# Patient Record
Sex: Male | Born: 1975 | Race: White | Hispanic: No | Marital: Married | Smoking: Never smoker
Health system: Southern US, Community
[De-identification: ages and names within clinical notes are randomized; demographics above are authoritative.]

## PROBLEM LIST (undated history)

## (undated) DIAGNOSIS — K219 Gastro-esophageal reflux disease without esophagitis: Secondary | ICD-10-CM

## (undated) DIAGNOSIS — J309 Allergic rhinitis, unspecified: Secondary | ICD-10-CM

## (undated) DIAGNOSIS — E785 Hyperlipidemia, unspecified: Secondary | ICD-10-CM

## (undated) DIAGNOSIS — R7309 Other abnormal glucose: Secondary | ICD-10-CM

## (undated) HISTORY — PX: TONSILLECTOMY AND ADENOIDECTOMY: SHX28

## (undated) HISTORY — DX: Hyperlipidemia, unspecified: E78.5

## (undated) HISTORY — DX: Gastro-esophageal reflux disease without esophagitis: K21.9

## (undated) HISTORY — DX: Allergic rhinitis, unspecified: J30.9

## (undated) HISTORY — PX: TONSILLECTOMY: SUR1361

## (undated) HISTORY — DX: Other abnormal glucose: R73.09

---

## 2003-06-29 ENCOUNTER — Ambulatory Visit (HOSPITAL_BASED_OUTPATIENT_CLINIC_OR_DEPARTMENT_OTHER): Admission: RE | Admit: 2003-06-29 | Discharge: 2003-06-29 | Payer: Self-pay | Admitting: Sports Medicine

## 2007-02-10 ENCOUNTER — Emergency Department (HOSPITAL_COMMUNITY): Admission: EM | Admit: 2007-02-10 | Discharge: 2007-02-10 | Payer: Self-pay | Admitting: Emergency Medicine

## 2010-07-01 ENCOUNTER — Ambulatory Visit
Admission: RE | Admit: 2010-07-01 | Discharge: 2010-07-01 | Disposition: A | Discharge status: Home / Self Care | Payer: BC Managed Care – PPO | Source: Ambulatory Visit | Attending: Internal Medicine | Admitting: Internal Medicine

## 2010-07-01 ENCOUNTER — Other Ambulatory Visit: Payer: Self-pay | Admitting: Internal Medicine

## 2010-07-01 DIAGNOSIS — R05 Cough: Secondary | ICD-10-CM

## 2010-07-29 ENCOUNTER — Ambulatory Visit
Admission: RE | Admit: 2010-07-29 | Discharge: 2010-07-29 | Disposition: A | Discharge status: Home / Self Care | Payer: BC Managed Care – PPO | Source: Ambulatory Visit | Attending: Internal Medicine | Admitting: Internal Medicine

## 2010-07-29 ENCOUNTER — Other Ambulatory Visit: Payer: Self-pay | Admitting: Internal Medicine

## 2010-07-29 DIAGNOSIS — R05 Cough: Secondary | ICD-10-CM

## 2010-09-27 LAB — POCT RAPID STREP A: Streptococcus, Group A Screen (Direct): NEGATIVE

## 2012-02-29 ENCOUNTER — Encounter (HOSPITAL_COMMUNITY): Payer: Self-pay | Admitting: Emergency Medicine

## 2012-02-29 ENCOUNTER — Emergency Department (HOSPITAL_COMMUNITY): Payer: BC Managed Care – PPO

## 2012-02-29 ENCOUNTER — Emergency Department (HOSPITAL_COMMUNITY)
Admission: EM | Admit: 2012-02-29 | Discharge: 2012-02-29 | Disposition: A | Discharge status: Home / Self Care | Payer: BC Managed Care – PPO | Attending: Emergency Medicine | Admitting: Emergency Medicine

## 2012-02-29 DIAGNOSIS — Y9289 Other specified places as the place of occurrence of the external cause: Secondary | ICD-10-CM | POA: Insufficient documentation

## 2012-02-29 DIAGNOSIS — R110 Nausea: Secondary | ICD-10-CM | POA: Insufficient documentation

## 2012-02-29 DIAGNOSIS — W1809XA Striking against other object with subsequent fall, initial encounter: Secondary | ICD-10-CM | POA: Insufficient documentation

## 2012-02-29 DIAGNOSIS — S0990XA Unspecified injury of head, initial encounter: Secondary | ICD-10-CM | POA: Insufficient documentation

## 2012-02-29 DIAGNOSIS — R404 Transient alteration of awareness: Secondary | ICD-10-CM | POA: Insufficient documentation

## 2012-02-29 DIAGNOSIS — Y9301 Activity, walking, marching and hiking: Secondary | ICD-10-CM | POA: Insufficient documentation

## 2012-02-29 DIAGNOSIS — R51 Headache: Secondary | ICD-10-CM

## 2012-02-29 NOTE — ED Notes (Signed)
Pt states that on Friday he was at work and didn't want to wait for an elevator.  States he went up 10 flights of stairs.  When he got to the top, he felt winded, so he sat down and about a minute or two, stood up and then passed out.  States that it was unwitnessed.  Pt saw his PCP.  They did an EKG which was fine.  Pt did not have a headache at that time.  Since then, pt has developed a headache.  Was told that if he developed a headache to come to the ER.

## 2012-02-29 NOTE — ED Provider Notes (Signed)
History     CSN: [id number]  Arrival date & time 02/29/12  1903   First MD Initiated Contact with Patient 02/29/12 1939      Chief Complaint  Patient presents with  . Fall  . Loss of Consciousness  . Headache    (Consider location/radiation/quality/duration/timing/severity/associated sxs/prior treatment) HPI Patient suffered a syncopal event 2 days ago after he walked up 10 flights of steps, fell winded and nauseated. As a result syncopal event he struck his head. He has developed a mild right-sided parietal headache radiating to her right periorbital area gradual onset yesterday. He denies nausea denies visual changes denies neck pain denies other complaint. Nothing makes symptoms better or worse he denies chest pain no other associated symptoms. Patient was seen by his primary care physician after the syncopal event an outpatient cardiac workup has been arranged. He presently feels fine other than his headache. No treatment prior to coming here. History reviewed. No pertinent past medical history.  Past Surgical History  Procedure Laterality Date  . Tonsillectomy      History reviewed. No pertinent family history.  History  Substance Use Topics  . Smoking status: Never Smoker   . Smokeless tobacco: Not on file  . Alcohol Use: No   no alcohol no drugs    Review of Systems  Constitutional: Negative.   Respiratory: Negative.   Cardiovascular: Negative.   Gastrointestinal: Negative.   Musculoskeletal: Negative.   Skin: Negative.   Neurological: Positive for headaches.  Psychiatric/Behavioral: Negative.   All other systems reviewed and are negative.    Allergies  Review of patient's allergies indicates not on file.  Home Medications  No current outpatient prescriptions on file.  BP 120/74  Pulse 90  Temp(Src) 98.5 F (36.9 C) (Oral)  Resp 16  Ht 5\' 9"  (1.753 m)  Wt 235 lb (106.595 kg)  BMI 34.69 kg/m2  SpO2 100%  Physical Exam  Nursing note and vitals  reviewed. Constitutional: He appears well-developed and well-nourished.  HENT:  Head: Normocephalic and atraumatic.  Bilateral tympanic membranes normal  Eyes: Conjunctivae are normal. Pupils are equal, round, and reactive to light.  Neck: Neck supple. No tracheal deviation present. No thyromegaly present.  Cardiovascular: Normal rate and regular rhythm.   No murmur heard. Pulmonary/Chest: Effort normal and breath sounds normal.  Abdominal: Soft. Bowel sounds are normal. He exhibits no distension. There is no tenderness.  Musculoskeletal: Normal range of motion. He exhibits no edema and no tenderness.  Entire spine nontender  Neurological: He is alert. Coordination normal.  Gait normal Romberg normal pronator normal  Skin: Skin is warm and dry. No rash noted.  Psychiatric: He has a normal mood and affect.    ED Course  Procedures (including critical care time)  Labs Reviewed - No data to display No results found. 9:10 PM patient resting comfortably alert Glasgow Coma Score 15. Declined pain medicine No diagnosis found.  Results for orders placed during the hospital encounter of 02/10/07  POCT RAPID STREP A (DEVICE)      Result Value Range   Streptococcus, Group A Screen (Direct) NEGATIVE     Ct Head Wo Contrast  02/29/2012  *RADIOLOGY REPORT*  Clinical Data: Larey Seat.  Loss of consciousness.  CT HEAD WITHOUT CONTRAST  Technique:  Contiguous axial images were obtained from the base of the skull through the vertex without contrast.  Comparison: None  Findings: The ventricles are normal.  No extra-axial fluid collections are seen.  The brainstem and cerebellum are unremarkable.  No acute intracranial findings such as infarction or hemorrhage.  No mass lesions.  The bony calvarium is intact. No acute fracture.  Scattered sinus disease.  The mastoid air cells and middle ear cavities are clear.  IMPRESSION: No acute intracranial findings or skull fracture.   Original Report Authenticated By:  Rudie Meyer, M.D.      MDM  I discussed with the patient's spouse I do not feel that CT scan is absolutely warranted however they wish to have a CT scan to rule out injury. Ordered by me   Plan patient to followup with Dr. Kathryne Sharper office tomorrow for his outpatient cardiac workup for syncope. Tylenol as needed for pain Diagnosis postconcussive headache     Doug Sou, MD 02/29/12 2113

## 2013-06-14 ENCOUNTER — Ambulatory Visit (INDEPENDENT_AMBULATORY_CARE_PROVIDER_SITE_OTHER): Payer: BC Managed Care – PPO | Admitting: Emergency Medicine

## 2013-06-14 ENCOUNTER — Encounter: Payer: Self-pay | Admitting: Emergency Medicine

## 2013-06-14 VITALS — BP 114/64 | HR 78 | Temp 98.2°F | Resp 18 | Ht 68.5 in | Wt 235.0 lb

## 2013-06-14 DIAGNOSIS — R7309 Other abnormal glucose: Secondary | ICD-10-CM

## 2013-06-14 DIAGNOSIS — J309 Allergic rhinitis, unspecified: Secondary | ICD-10-CM

## 2013-06-14 DIAGNOSIS — K219 Gastro-esophageal reflux disease without esophagitis: Secondary | ICD-10-CM

## 2013-06-14 DIAGNOSIS — R5383 Other fatigue: Secondary | ICD-10-CM

## 2013-06-14 DIAGNOSIS — E782 Mixed hyperlipidemia: Secondary | ICD-10-CM

## 2013-06-14 DIAGNOSIS — R5381 Other malaise: Secondary | ICD-10-CM

## 2013-06-14 LAB — CBC WITH DIFFERENTIAL/PLATELET
Basophils Absolute: 0.1 10*3/uL (ref 0.0–0.1)
Basophils Relative: 1 % (ref 0–1)
EOS ABS: 0.1 10*3/uL (ref 0.0–0.7)
EOS PCT: 2 % (ref 0–5)
HEMATOCRIT: 38.4 % — AB (ref 39.0–52.0)
HEMOGLOBIN: 13.1 g/dL (ref 13.0–17.0)
LYMPHS ABS: 2.3 10*3/uL (ref 0.7–4.0)
Lymphocytes Relative: 38 % (ref 12–46)
MCH: 28.2 pg (ref 26.0–34.0)
MCHC: 34.1 g/dL (ref 30.0–36.0)
MCV: 82.8 fL (ref 78.0–100.0)
MONO ABS: 0.5 10*3/uL (ref 0.1–1.0)
MONOS PCT: 9 % (ref 3–12)
Neutro Abs: 3.1 10*3/uL (ref 1.7–7.7)
Neutrophils Relative %: 50 % (ref 43–77)
Platelets: 225 10*3/uL (ref 150–400)
RBC: 4.64 MIL/uL (ref 4.22–5.81)
RDW: 14.4 % (ref 11.5–15.5)
WBC: 6.1 10*3/uL (ref 4.0–10.5)

## 2013-06-14 MED ORDER — PRAVASTATIN SODIUM 10 MG PO TABS
10.0000 mg | ORAL_TABLET | Freq: Every day | ORAL | Status: DC
Start: 2013-06-14 — End: 2013-09-21

## 2013-06-14 NOTE — Progress Notes (Signed)
Subjective:    Patient ID: Adrian Francis, male    DOB: 06-26-1975, 38 y.o.   MRN: [id number]  HPI Comments: 38 YO WM presents for 3 month F/U for Cholesterol, Pre-Dm, D. Deficient. He is exercising more. He is eating healthier. He has had sleep study in past with + result but was unable to wear mask due to skin irritation. He has been having more sleep difficulty and more fatigue. He has gained weight since study in 2006. He has not followed up AD for recheck of labs.   T 168 TG 145 L 99 A1C 6.0 TO 5.5  Hyperlipidemia  Gastrophageal Reflux Associated symptoms include fatigue.     Medication List       This list is accurate as of: 06/14/13 11:59 PM.  Always use your most recent med list.               aspirin 81 MG tablet  Take 81 mg by mouth daily.     Glucosamine-MSM 500-500 MG Tabs  Take 1,500 mg by mouth 3 (three) times a week.     loratadine 10 MG tablet  Commonly known as:  CLARITIN  Take 10 mg by mouth every morning.     Magnesium 250 MG Tabs  Take 250 mg by mouth every morning.     Melatonin 3 MG Tabs  Take 6 mg by mouth at bedtime as needed (for sleep).     montelukast 10 MG tablet  Commonly known as:  SINGULAIR  Take 10 mg by mouth every morning.     OVER THE COUNTER MEDICATION  Take 2 capsules by mouth daily. zyflamend - spice mixture as an antiinflammatory     OVER THE COUNTER MEDICATION  Take 1-2 capsules by mouth daily as needed (for anxiety). True calm     OVER THE COUNTER MEDICATION  2 tablets daily. Microlactin     OVER THE COUNTER MEDICATION  2 tablets daily as needed. Quercetin with Bromelein     pravastatin 10 MG tablet  Commonly known as:  PRAVACHOL  Take 1 tablet (10 mg total) by mouth at bedtime.     ranitidine 150 MG tablet  Commonly known as:  ZANTAC  Take 150 mg by mouth 2 (two) times daily.     Vitamin D-3 5000 UNITS Tabs  Take 5,000 Units by mouth daily.     Zinc 50 MG Tabs  Take 50 mg by mouth daily.       No Known  Allergies  Past Medical History  Diagnosis Date  . Hyperlipidemia   . Other abnormal glucose   . GERD (gastroesophageal reflux disease)   . Allergic rhinitis, cause unspecified      Review of Systems  Constitutional: Positive for fatigue.  Psychiatric/Behavioral: Positive for sleep disturbance.  All other systems reviewed and are negative.  BP 114/64  Pulse 78  Temp(Src) 98.2 F (36.8 C) (Temporal)  Resp 18  Ht 5' 8.5" (1.74 m)  Wt 235 lb (106.595 kg)  BMI 35.21 kg/m2     Objective:   Physical Exam  Nursing note and vitals reviewed. Constitutional: He is oriented to person, place, and time. He appears well-developed and well-nourished.  obese  HENT:  Head: Normocephalic and atraumatic.  Right Ear: External ear normal.  Left Ear: External ear normal.  Nose: Nose normal.  Eyes: Conjunctivae and EOM are normal.  Neck: Normal range of motion. Neck supple. No JVD present. No thyromegaly present.  Cardiovascular: Normal rate, regular  rhythm, normal heart sounds and intact distal pulses.   Pulmonary/Chest: Effort normal and breath sounds normal.  Abdominal: Soft. Bowel sounds are normal. He exhibits no distension and no mass. There is no tenderness. There is no rebound and no guarding.  Musculoskeletal: Normal range of motion. He exhibits no edema and no tenderness.  Lymphadenopathy:    He has no cervical adenopathy.  Neurological: He is alert and oriented to person, place, and time. He has normal reflexes. No cranial nerve deficit. Coordination normal.  Skin: Skin is warm and dry.  Psychiatric: He has a normal mood and affect. His behavior is normal. Judgment and thought content normal.          Assessment & Plan:  1. Cholesterol- recheck labs, Need to eat healthier and exercise AD.  2. Elevated BS- recheck labs  3. Fatigue vs sleep issues- Will let us know if desires repeat sleep study with mask intolerance due to skin allergy in past. Wife wants to try weight  loss first. Advised of sleep hygiene

## 2013-06-14 NOTE — Patient Instructions (Signed)
Sleep Apnea  Sleep apnea is disorder that affects a person's sleep. A person with sleep apnea has abnormal pauses in their breathing when they sleep. It is hard for them to get a good sleep. This makes a person tired during the day. It also can lead to other physical problems. There are three types of sleep apnea. One type is when breathing stops for a short time because your airway is blocked (obstructive sleep apnea). Another type is when the brain sometimes fails to give the normal signal to breathe to the muscles that control your breathing (central sleep apnea). The third type is a combination of the other two types.  HOME CARE  · Do not sleep on your back. Try to sleep on your side.  · Take all medicine as told by your doctor.  · Avoid alcohol, calming medicines (sedatives), and depressant drugs.  · Try to lose weight if you are overweight. Talk to your doctor about a healthy weight goal.  Your doctor may have you use a device that helps to open your airway. It can help you get the air that you need. It is called a positive airway pressure (PAP) device. There are three types of PAP devices:  · Continuous positive airway pressure (CPAP) device.  · Nasal expiratory positive airway pressure (EPAP) device.  · Bilevel positive airway pressure (BPAP) device.  MAKE SURE YOU:  · Understand these instructions.  · Will watch your condition.  · Will get help right away if you are not doing well or get worse.  Document Released: 10/02/2007 Document Revised: 12/10/2011 Document Reviewed: 04/26/2011  ExitCare® Patient Information ©2014 [employer].

## 2013-06-15 ENCOUNTER — Encounter: Payer: Self-pay | Admitting: Emergency Medicine

## 2013-06-15 ENCOUNTER — Other Ambulatory Visit: Payer: Self-pay | Admitting: Internal Medicine

## 2013-06-15 DIAGNOSIS — E782 Mixed hyperlipidemia: Secondary | ICD-10-CM | POA: Insufficient documentation

## 2013-06-15 DIAGNOSIS — R7303 Prediabetes: Secondary | ICD-10-CM | POA: Insufficient documentation

## 2013-06-15 DIAGNOSIS — J309 Allergic rhinitis, unspecified: Secondary | ICD-10-CM | POA: Insufficient documentation

## 2013-06-15 DIAGNOSIS — K219 Gastro-esophageal reflux disease without esophagitis: Secondary | ICD-10-CM | POA: Insufficient documentation

## 2013-06-15 LAB — HEPATIC FUNCTION PANEL
ALBUMIN: 4.6 g/dL (ref 3.5–5.2)
ALK PHOS: 56 U/L (ref 39–117)
ALT: 25 U/L (ref 0–53)
AST: 20 U/L (ref 0–37)
BILIRUBIN INDIRECT: 0.4 mg/dL (ref 0.2–1.2)
BILIRUBIN TOTAL: 0.5 mg/dL (ref 0.2–1.2)
Bilirubin, Direct: 0.1 mg/dL (ref 0.0–0.3)
TOTAL PROTEIN: 7.4 g/dL (ref 6.0–8.3)

## 2013-06-15 LAB — INSULIN, FASTING: Insulin fasting, serum: 9 u[IU]/mL (ref 3–28)

## 2013-06-15 LAB — LIPID PANEL
CHOL/HDL RATIO: 3.8 ratio
Cholesterol: 180 mg/dL (ref 0–200)
HDL: 47 mg/dL (ref >39)
LDL CALC: 108 mg/dL — AB (ref 0–99)
TRIGLYCERIDES: 123 mg/dL (ref <150)
VLDL: 25 mg/dL (ref 0–40)

## 2013-06-15 LAB — BASIC METABOLIC PANEL WITH GFR
BUN: 15 mg/dL (ref 6–23)
CALCIUM: 9.7 mg/dL (ref 8.4–10.5)
CO2: 29 meq/L (ref 19–32)
CREATININE: 1.11 mg/dL (ref 0.50–1.35)
Chloride: 101 mEq/L (ref 96–112)
GFR, Est Non African American: 86 mL/min
GLUCOSE: 84 mg/dL (ref 70–99)
Potassium: 4.2 mEq/L (ref 3.5–5.3)
Sodium: 137 mEq/L (ref 135–145)

## 2013-06-15 LAB — HEMOGLOBIN A1C
Hgb A1c MFr Bld: 5.8 % — ABNORMAL HIGH (ref <5.7)
Mean Plasma Glucose: 120 mg/dL — ABNORMAL HIGH (ref <117)

## 2013-06-15 LAB — TSH: TSH: 1.218 u[IU]/mL (ref 0.350–4.500)

## 2013-06-15 LAB — VITAMIN B12: Vitamin B-12: 447 pg/mL (ref 211–911)

## 2013-06-15 LAB — TESTOSTERONE: Testosterone: 317 ng/dL (ref 300–890)

## 2013-07-07 ENCOUNTER — Encounter: Payer: Self-pay | Admitting: Cardiology

## 2013-09-21 ENCOUNTER — Ambulatory Visit (INDEPENDENT_AMBULATORY_CARE_PROVIDER_SITE_OTHER): Payer: BC Managed Care – PPO | Admitting: Emergency Medicine

## 2013-09-21 ENCOUNTER — Encounter: Payer: Self-pay | Admitting: Emergency Medicine

## 2013-09-21 VITALS — BP 116/82 | HR 62 | Temp 98.2°F | Resp 16 | Ht 68.5 in | Wt 233.0 lb

## 2013-09-21 DIAGNOSIS — E782 Mixed hyperlipidemia: Secondary | ICD-10-CM

## 2013-09-21 DIAGNOSIS — Z23 Encounter for immunization: Secondary | ICD-10-CM

## 2013-09-21 DIAGNOSIS — Z125 Encounter for screening for malignant neoplasm of prostate: Secondary | ICD-10-CM

## 2013-09-21 DIAGNOSIS — Z111 Encounter for screening for respiratory tuberculosis: Secondary | ICD-10-CM

## 2013-09-21 DIAGNOSIS — Z0000 Encounter for general adult medical examination without abnormal findings: Secondary | ICD-10-CM

## 2013-09-21 NOTE — Patient Instructions (Signed)
Tdap Vaccine (Tetanus, Diphtheria, Pertussis): What You Need to Know 1. Why get vaccinated? Tetanus, diphtheria and pertussis can be very serious diseases, even for adolescents and adults. Tdap vaccine can protect us from these diseases. TETANUS (Lockjaw) causes painful muscle tightening and stiffness, usually all over the body.  It can lead to tightening of muscles in the head and neck so you can't open your mouth, swallow, or sometimes even breathe. Tetanus kills about 1 out of 5 people who are infected. DIPHTHERIA can cause a thick coating to form in the back of the throat.  It can lead to breathing problems, paralysis, heart failure, and death. PERTUSSIS (Whooping Cough) causes severe coughing spells, which can cause difficulty breathing, vomiting and disturbed sleep.  It can also lead to weight loss, incontinence, and rib fractures. Up to 2 in 100 adolescents and 5 in 100 adults with pertussis are hospitalized or have complications, which could include pneumonia or death. These diseases are caused by bacteria. Diphtheria and pertussis are spread from person to person through coughing or sneezing. Tetanus enters the body through cuts, scratches, or wounds. Before vaccines, the [place] saw as many as 200,000 cases a year of diphtheria and pertussis, and hundreds of cases of tetanus. Since vaccination began, tetanus and diphtheria have dropped by about 99% and pertussis by about 80%. 2. Tdap vaccine Tdap vaccine can protect adolescents and adults from tetanus, diphtheria, and pertussis. One dose of Tdap is routinely given at age 11 or 12. People who did not get Tdap at that age should get it as soon as possible. Tdap is especially important for health care professionals and anyone having close contact with a baby younger than 12 months. Pregnant women should get a dose of Tdap during every pregnancy, to protect the newborn from pertussis. Infants are most at risk for severe, life-threatening  complications from pertussis. A similar vaccine, called Td, protects from tetanus and diphtheria, but not pertussis. A Td booster should be given every 10 years. Tdap may be given as one of these boosters if you have not already gotten a dose. Tdap may also be given after a severe cut or burn to prevent tetanus infection. Your doctor can give you more information. Tdap may safely be given at the same time as other vaccines. 3. Some people should not get this vaccine  If you ever had a life-threatening allergic reaction after a dose of any tetanus, diphtheria, or pertussis containing vaccine, OR if you have a severe allergy to any part of this vaccine, you should not get Tdap. Tell your doctor if you have any severe allergies.  If you had a coma, or long or multiple seizures within 7 days after a childhood dose of DTP or DTaP, you should not get Tdap, unless a cause other than the vaccine was found. You can still get Td.  Talk to your doctor if you:  have epilepsy or another nervous system problem,  had severe pain or swelling after any vaccine containing diphtheria, tetanus or pertussis,  ever had Guillain-Barr Syndrome (GBS),  aren't feeling well on the day the shot is scheduled. 4. Risks of a vaccine reaction With any medicine, including vaccines, there is a chance of side effects. These are usually mild and go away on their own, but serious reactions are also possible. Brief fainting spells can follow a vaccination, leading to injuries from falling. Sitting or lying down for about 15 minutes can help prevent these. Tell your doctor if you feel   dizzy or light-headed, or have vision changes or ringing in the ears. Mild problems following Tdap (Did not interfere with activities)  Pain where the shot was given (about 3 in 4 adolescents or 2 in 3 adults)  Redness or swelling where the shot was given (about 1 person in 5)  Mild fever of at least 100.4F (up to about 1 in 25 adolescents or  1 in 100 adults)  Headache (about 3 or 4 people in 10)  Tiredness (about 1 person in 3 or 4)  Nausea, vomiting, diarrhea, stomach ache (up to 1 in 4 adolescents or 1 in 10 adults)  Chills, body aches, sore joints, rash, swollen glands (uncommon) Moderate problems following Tdap (Interfered with activities, but did not require medical attention)  Pain where the shot was given (about 1 in 5 adolescents or 1 in 100 adults)  Redness or swelling where the shot was given (up to about 1 in 16 adolescents or 1 in 25 adults)  Fever over 102F (about 1 in 100 adolescents or 1 in 250 adults)  Headache (about 3 in 20 adolescents or 1 in 10 adults)  Nausea, vomiting, diarrhea, stomach ache (up to 1 or 3 people in 100)  Swelling of the entire arm where the shot was given (up to about 3 in 100). Severe problems following Tdap (Unable to perform usual activities; required medical attention)  Swelling, severe pain, bleeding and redness in the arm where the shot was given (rare). A severe allergic reaction could occur after any vaccine (estimated less than 1 in a million doses). 5. What if there is a serious reaction? What should I look for?  Look for anything that concerns you, such as signs of a severe allergic reaction, very high fever, or behavior changes. Signs of a severe allergic reaction can include hives, swelling of the face and throat, difficulty breathing, a fast heartbeat, dizziness, and weakness. These would start a few minutes to a few hours after the vaccination. What should I do?  If you think it is a severe allergic reaction or other emergency that can't wait, call 9-1-1 or get the person to the nearest hospital. Otherwise, call your doctor.  Afterward, the reaction should be reported to the "Vaccine Adverse Event Reporting System" (VAERS). Your doctor might file this report, or you can do it yourself through the VAERS web site at www.vaers.hhs.gov, or by calling  1-800-822-7967. VAERS is only for reporting reactions. They do not give medical advice.  6. The National Vaccine Injury Compensation Program The National Vaccine Injury Compensation Program (VICP) is a federal program that was created to compensate people who may have been injured by certain vaccines. Persons who believe they may have been injured by a vaccine can learn about the program and about filing a claim by calling 1-800-338-2382 or visiting the VICP website at www.hrsa.gov/vaccinecompensation. 7. How can I learn more?  Ask your doctor.  Call your local or state health department.  Contact the Centers for Disease Control and Prevention (CDC):  Call 1-800-232-4636 or visit CDC's website at www.cdc.gov/vaccines. CDC Tdap Vaccine VIS (05/15/11) Document Released: 06/24/2011 Document Revised: 05/09/2013 Document Reviewed: 04/06/2013 Exit[employer] Patient Information 2015 [employer]. This information is not intended to replace advice given to you by your health care provider. Make sure you discuss any questions you have with your health care provider. Tuberculin Skin Test The PPD skin test is a method used to help with the diagnosis of a disease called tuberculosis (TB). HOW THE TEST   IS DONE  The test site (usually the forearm) is cleansed. The PPD extract is then injected under the top layer of skin, causing a blister to form on the skin. The reaction will take 48 - 72 hours to develop. You must return to your health care provider within that time to have the area checked. This will determine whether you have had a significant reaction to the PPD test. A reaction is measured in millimeters of hard swelling (induration) at the site. PREPARATION FOR TEST  There is no special preparation for this test. People with a skin rash or other skin irritations on their arms may need to have the test performed at a different spot on the body. Tell your health care provider if you have ever had a  positive PPD skin test. If so, you should not have a repeat PPD test. Tell your doctor if you have a medical condition or if you take certain drugs, such as steroids, that can affect your immune system. These situations may lead to inaccurate test results. NORMAL FINDINGS A negative reaction (no induration) or a level of hard swelling that falls below a certain cutoff may mean that a person has not been infected with the bacteria that cause TB. There are different cutoffs for children, people with HIV, and other risk groups. Unfortunately, this is not a perfect test, and up to 20% of people infected with tuberculosis may not have a reaction on the PPD skin test. In addition, certain conditions that affect the immune system (cancer, recent chemotherapy, late-stage AIDS) may cause a false-negative test result.  The reaction will take 48 - 72 hours to develop. You must return to your health care provider within that time to have the area checked. Follow your caregiver's instructions as to where and when to report for this to be done. Ranges for normal findings may vary among different laboratories and hospitals. You should always check with your doctor after having lab work or other tests done to discuss the meaning of your test results and whether your values are considered within normal limits. WHAT ABNORMAL RESULTS MEAN  The results of the test depend on the size of the skin reaction and on the person being tested.  A small reaction (5 mm of hard swelling at the site) is considered to be positive in people who have HIV, who are taking steroid therapy, or who have been in close contact with a person who has active tuberculosis. Larger reactions (greater than or equal to 10 mm) are considered positive in people with diabetes or kidney failure, and in health care workers, among others. In people with no known risks for tuberculosis, a positive reaction requires 15 mm or more of hard swelling at the  site. RISKS AND COMPLICATIONS There is a very small risk of severe redness and swelling of the arm in people who have had a previous positive PPD test and who have the test again. There also have been a few rare cases of this reaction in people who have not been tested before. CONSIDERATIONS  A positive skin test does not necessarily mean that a person has active tuberculosis. More tests will be done to check whether active disease is present. Many people who were born outside the [place] may have had a vaccine called "BCG," which can lead to a false-positive test result. MEANING OF TEST  Your caregiver will go over the test results with you and discuss the importance and meaning of your results,   as well as treatment options and the need for additional tests if necessary. OBTAINING THE TEST RESULTS It is your responsibility to obtain your test results. Ask the lab or department performing the test when and how you will get your results. Document Released: 10/02/2004 Document Revised: 03/17/2011 Document Reviewed: 04/01/2013 Exit[employer] Patient Information 2015 [employer]. This information is not intended to replace advice given to you by your health care provider. Make sure you discuss any questions you have with your health care provider.  

## 2013-09-21 NOTE — Progress Notes (Signed)
Subjective:    Patient ID: Adrian Francis, male    DOB: 1975-12-16, 38 y.o.   MRN: [id number]  HPI Comments: 38 yo WM CPE and cholesterol f/u. He is eating healthy for most part. He has restarted exercising x 1 week. He has increased pravastatin AD. He is doing well overall. He notes taking multiple supplements which have helped with allergies/ reflux.    WBC             6.1   06/14/2013 HGB            13.1   06/14/2013 HCT            38.4   06/14/2013 PLT             225   06/14/2013 GLUCOSE          84   06/14/2013 CHOL            180   06/14/2013 TRIG            123   06/14/2013 HDL              47   06/14/2013 LDLCALC         108   06/14/2013 ALT              25   06/14/2013 AST              20   06/14/2013 NA              137   06/14/2013 K               4.2   06/14/2013 CL              101   06/14/2013 CREATININE     1.11   06/14/2013 BUN              15   06/14/2013 CO2              29   06/14/2013 TSH           1.218   06/14/2013 HGBA1C          5.8   06/14/2013   Hyperlipidemia Pertinent negatives include no chest pain or shortness of breath.  Gastrophageal Reflux He reports no chest pain. Pertinent negatives include no fatigue.     Medication List       This list is accurate as of: 09/21/13 11:59 PM.  Always use your most recent med list.               aspirin 81 MG tablet  Take 81 mg by mouth daily.     loratadine 10 MG tablet  Commonly known as:  CLARITIN  Take 10 mg by mouth every morning.     Magnesium 250 MG Tabs  Take 250 mg by mouth every morning.     Melatonin 3 MG Tabs  Take 6 mg by mouth at bedtime as needed (for sleep).     montelukast 10 MG tablet  Commonly known as:  SINGULAIR  TAKE 1 TABLET DAILY FOR ALLERGY     OVER THE COUNTER MEDICATION  Take 2 capsules by mouth daily. zyflamend - spice mixture as an antiinflammatory     OVER THE COUNTER MEDICATION  Take 1-2 capsules by mouth daily as needed (for anxiety). True calm     OVER THE COUNTER MEDICATION  2 tablets  daily. Microlactin     OVER THE  COUNTER MEDICATION  2 tablets daily as needed. Quercetin with Bromelein     OVER THE COUNTER MEDICATION  at bedtime. Cherry tart     pravastatin 10 MG tablet  Commonly known as:  PRAVACHOL  Take 10 mg by mouth at bedtime. Takes 1 and 1/2 pills M,W,F= 15 mg     PROAIR HFA 108 (90 BASE) MCG/ACT inhaler  Generic drug:  albuterol  Inhale into the lungs as needed for wheezing or shortness of breath.     ranitidine 150 MG tablet  Commonly known as:  ZANTAC  Take 150 mg by mouth 2 (two) times daily.     SUPER B COMPLEX PO  Take by mouth daily.     Vitamin D-3 5000 UNITS Tabs  Take 5,000 Units by mouth daily.     Zinc 50 MG Tabs  Take 50 mg by mouth daily.      No Known Allergies Past Medical History  Diagnosis Date  . Hyperlipidemia   . Other abnormal glucose   . GERD (gastroesophageal reflux disease)   . Allergic rhinitis, cause unspecified    Past Surgical History  Procedure Laterality Date  . Tonsillectomy    . Tonsillectomy and adenoidectomy     History  Substance Use Topics  . Smoking status: Never Smoker   . Smokeless tobacco: Not on file  . Alcohol Use: No   Family History  Problem Relation Age of Onset  . Lupus Mother   . Hypertension Father   . Cancer Father     precancerous polyps  . Diabetes Maternal Grandmother   . Cancer Maternal Grandmother     melanoma  . Cancer Paternal Grandfather     colon/ prostate  . Heart disease Other   . Stroke Other      MAINTENANCE: Colonoscopy:2014 WNL EYE: yearly Dentist: q 6 month  IMMUNIZATIONS: Td:2006/ TDAP  Updated today Influenza: AT work 2014  Patient Care Team: Lucky Cowboy, MD as PCP - General (Internal Medicine) Elmon Else, MD as Consulting Physician (Dermatology) Donato Schultz, MD as Consulting Physician (Cardiology) Theda Belfast, MD as Consulting Physician (Gastroenterology) [facility], (Dentist) Rubye Oaks, (eye)    Review of Systems  Constitutional:  Negative for fatigue.  Respiratory: Negative for shortness of breath.   Cardiovascular: Negative for chest pain.  All other systems reviewed and are negative.  BP 116/82  Pulse 62  Temp(Src) 98.2 F (36.8 C) (Temporal)  Resp 16  Ht 5' 8.5" (1.74 m)  Wt 233 lb (105.688 kg)  BMI 34.91 kg/m2     Objective:   Physical Exam  Nursing note and vitals reviewed. Constitutional: He is oriented to person, place, and time. He appears well-developed and well-nourished.  HENT:  Head: Normocephalic and atraumatic.  Right Ear: External ear normal.  Left Ear: External ear normal.  Nose: Nose normal.  Mouth/Throat: Oropharynx is clear and moist.  Eyes: Conjunctivae and EOM are normal. Pupils are equal, round, and reactive to light. Right eye exhibits no discharge. Left eye exhibits no discharge. No scleral icterus.  Neck: Normal range of motion. Neck supple. No JVD present. No tracheal deviation present. No thyromegaly present.  Cardiovascular: Normal rate, regular rhythm, normal heart sounds and intact distal pulses.   Pulmonary/Chest: Effort normal and breath sounds normal.  Abdominal: Soft. Bowel sounds are normal. He exhibits no distension and no mass. There is no tenderness. There is no rebound and no guarding.  Musculoskeletal: Normal range of motion. He exhibits no edema and no tenderness.  Lymphadenopathy:    He has no cervical adenopathy.  Neurological: He is alert and oriented to person, place, and time. He has normal reflexes. No cranial nerve deficit. He exhibits normal muscle tone. Coordination normal.  Skin: Skin is warm and dry. No rash noted. No erythema. No pallor.  Psychiatric: He has a normal mood and affect. His behavior is normal. Judgment and thought content normal.      EKG NSCSPT WNL     Assessment & Plan:  1. CPE- Update screening labs/ History/ Immunizations/ Testing as needed. Advised healthy diet, QD exercise, increase H20 and continue RX/ Vitamins AD.  2.  Cholesterol- recheck labs, Need to eat healthier and exercise AD.    3. GERD- Diet/ hygiene discussed, May try OTC Nexium and call with results

## 2013-09-22 LAB — CBC WITH DIFFERENTIAL/PLATELET
BASOS ABS: 0 10*3/uL (ref 0.0–0.1)
BASOS PCT: 0 % (ref 0–1)
EOS PCT: 2 % (ref 0–5)
Eosinophils Absolute: 0.1 10*3/uL (ref 0.0–0.7)
HEMATOCRIT: 41.3 % (ref 39.0–52.0)
Hemoglobin: 13.8 g/dL (ref 13.0–17.0)
Lymphocytes Relative: 36 % (ref 12–46)
Lymphs Abs: 1.9 10*3/uL (ref 0.7–4.0)
MCH: 28.4 pg (ref 26.0–34.0)
MCHC: 33.4 g/dL (ref 30.0–36.0)
MCV: 85 fL (ref 78.0–100.0)
MONO ABS: 0.5 10*3/uL (ref 0.1–1.0)
Monocytes Relative: 9 % (ref 3–12)
NEUTROS ABS: 2.8 10*3/uL (ref 1.7–7.7)
Neutrophils Relative %: 53 % (ref 43–77)
Platelets: 253 10*3/uL (ref 150–400)
RBC: 4.86 MIL/uL (ref 4.22–5.81)
RDW: 14.3 % (ref 11.5–15.5)
WBC: 5.3 10*3/uL (ref 4.0–10.5)

## 2013-09-22 LAB — URINALYSIS, ROUTINE W REFLEX MICROSCOPIC
Bilirubin Urine: NEGATIVE
GLUCOSE, UA: NEGATIVE mg/dL
Hgb urine dipstick: NEGATIVE
Ketones, ur: NEGATIVE mg/dL
LEUKOCYTES UA: NEGATIVE
Nitrite: NEGATIVE
PH: 5.5 (ref 5.0–8.0)
Protein, ur: NEGATIVE mg/dL
Specific Gravity, Urine: 1.019 (ref 1.005–1.030)
Urobilinogen, UA: 0.2 mg/dL (ref 0.0–1.0)

## 2013-09-22 LAB — LIPID PANEL
CHOL/HDL RATIO: 4.1 ratio
CHOLESTEROL: 174 mg/dL (ref 0–200)
HDL: 42 mg/dL (ref >39)
LDL CALC: 91 mg/dL (ref 0–99)
Triglycerides: 204 mg/dL — ABNORMAL HIGH (ref <150)
VLDL: 41 mg/dL — AB (ref 0–40)

## 2013-09-22 LAB — MAGNESIUM: Magnesium: 2 mg/dL (ref 1.5–2.5)

## 2013-09-22 LAB — BASIC METABOLIC PANEL WITH GFR
BUN: 17 mg/dL (ref 6–23)
CO2: 24 mEq/L (ref 19–32)
Calcium: 9.7 mg/dL (ref 8.4–10.5)
Chloride: 99 mEq/L (ref 96–112)
Creat: 1.23 mg/dL (ref 0.50–1.35)
GFR, EST AFRICAN AMERICAN: 87 mL/min
GFR, EST NON AFRICAN AMERICAN: 76 mL/min
Glucose, Bld: 87 mg/dL (ref 70–99)
POTASSIUM: 4.2 meq/L (ref 3.5–5.3)
Sodium: 138 mEq/L (ref 135–145)

## 2013-09-22 LAB — INSULIN, FASTING: Insulin fasting, serum: 8.3 u[IU]/mL (ref 2.0–19.6)

## 2013-09-22 LAB — HEPATIC FUNCTION PANEL
ALK PHOS: 55 U/L (ref 39–117)
ALT: 24 U/L (ref 0–53)
AST: 22 U/L (ref 0–37)
Albumin: 4.7 g/dL (ref 3.5–5.2)
BILIRUBIN DIRECT: 0.1 mg/dL (ref 0.0–0.3)
BILIRUBIN INDIRECT: 0.3 mg/dL (ref 0.2–1.2)
BILIRUBIN TOTAL: 0.4 mg/dL (ref 0.2–1.2)
Total Protein: 7.4 g/dL (ref 6.0–8.3)

## 2013-09-22 LAB — MICROALBUMIN / CREATININE URINE RATIO
CREATININE, URINE: 180.9 mg/dL
MICROALB/CREAT RATIO: 5.6 mg/g (ref 0.0–30.0)
Microalb, Ur: 1.01 mg/dL (ref 0.00–1.89)

## 2013-09-22 LAB — HEMOGLOBIN A1C
Hgb A1c MFr Bld: 5.8 % — ABNORMAL HIGH (ref <5.7)
MEAN PLASMA GLUCOSE: 120 mg/dL — AB (ref <117)

## 2013-09-22 LAB — VITAMIN D 25 HYDROXY (VIT D DEFICIENCY, FRACTURES): Vit D, 25-Hydroxy: 71 ng/mL (ref 30–89)

## 2013-09-22 LAB — VITAMIN B12: VITAMIN B 12: 593 pg/mL (ref 211–911)

## 2013-09-22 LAB — TSH: TSH: 1.619 u[IU]/mL (ref 0.350–4.500)

## 2013-09-22 LAB — PSA: PSA: 1.15 ng/mL (ref <=4.00)

## 2013-09-22 LAB — TESTOSTERONE: Testosterone: 259 ng/dL — ABNORMAL LOW (ref 300–890)

## 2013-09-23 LAB — TB SKIN TEST
Induration: 0 mm
TB Skin Test: NEGATIVE

## 2013-09-25 ENCOUNTER — Other Ambulatory Visit: Payer: Self-pay | Admitting: Emergency Medicine

## 2013-09-25 MED ORDER — PRAVASTATIN SODIUM 20 MG PO TABS
20.0000 mg | ORAL_TABLET | Freq: Every evening | ORAL | Status: DC
Start: 2013-09-25 — End: 2014-05-24

## 2013-09-26 ENCOUNTER — Encounter: Payer: Self-pay | Admitting: Emergency Medicine

## 2013-09-26 NOTE — Progress Notes (Signed)
Patient called to schedule 4 month follow-up

## 2013-09-29 ENCOUNTER — Encounter: Payer: Self-pay | Admitting: Emergency Medicine

## 2014-01-23 ENCOUNTER — Encounter: Payer: Self-pay | Admitting: Physician Assistant

## 2014-01-23 ENCOUNTER — Ambulatory Visit (INDEPENDENT_AMBULATORY_CARE_PROVIDER_SITE_OTHER): Payer: BLUE CROSS/BLUE SHIELD | Admitting: Physician Assistant

## 2014-01-23 VITALS — BP 110/72 | HR 60 | Temp 97.9°F | Resp 16 | Ht 68.5 in | Wt 234.0 lb

## 2014-01-23 DIAGNOSIS — E669 Obesity, unspecified: Secondary | ICD-10-CM

## 2014-01-23 DIAGNOSIS — E782 Mixed hyperlipidemia: Secondary | ICD-10-CM

## 2014-01-23 DIAGNOSIS — Z79899 Other long term (current) drug therapy: Secondary | ICD-10-CM

## 2014-01-23 DIAGNOSIS — R7309 Other abnormal glucose: Secondary | ICD-10-CM

## 2014-01-23 DIAGNOSIS — E559 Vitamin D deficiency, unspecified: Secondary | ICD-10-CM | POA: Insufficient documentation

## 2014-01-23 DIAGNOSIS — R7303 Prediabetes: Secondary | ICD-10-CM

## 2014-01-23 DIAGNOSIS — E291 Testicular hypofunction: Secondary | ICD-10-CM

## 2014-01-23 LAB — HEPATIC FUNCTION PANEL
ALBUMIN: 4.4 g/dL (ref 3.5–5.2)
ALT: 26 U/L (ref 0–53)
AST: 19 U/L (ref 0–37)
Alkaline Phosphatase: 53 U/L (ref 39–117)
BILIRUBIN DIRECT: 0.1 mg/dL (ref 0.0–0.3)
BILIRUBIN INDIRECT: 0.3 mg/dL (ref 0.2–1.2)
BILIRUBIN TOTAL: 0.4 mg/dL (ref 0.2–1.2)
Total Protein: 7.6 g/dL (ref 6.0–8.3)

## 2014-01-23 LAB — CBC WITH DIFFERENTIAL/PLATELET
Basophils Absolute: 0 10*3/uL (ref 0.0–0.1)
Basophils Relative: 0 % (ref 0–1)
Eosinophils Absolute: 0.1 10*3/uL (ref 0.0–0.7)
Eosinophils Relative: 2 % (ref 0–5)
HCT: 41.4 % (ref 39.0–52.0)
HEMOGLOBIN: 14.3 g/dL (ref 13.0–17.0)
Lymphocytes Relative: 39 % (ref 12–46)
Lymphs Abs: 2.2 10*3/uL (ref 0.7–4.0)
MCH: 28.8 pg (ref 26.0–34.0)
MCHC: 34.5 g/dL (ref 30.0–36.0)
MCV: 83.3 fL (ref 78.0–100.0)
MPV: 9.3 fL (ref 8.6–12.4)
Monocytes Absolute: 0.5 10*3/uL (ref 0.1–1.0)
Monocytes Relative: 9 % (ref 3–12)
NEUTROS PCT: 50 % (ref 43–77)
Neutro Abs: 2.9 10*3/uL (ref 1.7–7.7)
PLATELETS: 220 10*3/uL (ref 150–400)
RBC: 4.97 MIL/uL (ref 4.22–5.81)
RDW: 13.7 % (ref 11.5–15.5)
WBC: 5.7 10*3/uL (ref 4.0–10.5)

## 2014-01-23 LAB — LIPID PANEL
CHOL/HDL RATIO: 4.7 ratio
CHOLESTEROL: 189 mg/dL (ref 0–200)
HDL: 40 mg/dL (ref >39)
LDL Cholesterol: 112 mg/dL — ABNORMAL HIGH (ref 0–99)
Triglycerides: 183 mg/dL — ABNORMAL HIGH (ref <150)
VLDL: 37 mg/dL (ref 0–40)

## 2014-01-23 LAB — HEMOGLOBIN A1C
Hgb A1c MFr Bld: 5.8 % — ABNORMAL HIGH (ref <5.7)
MEAN PLASMA GLUCOSE: 120 mg/dL — AB (ref <117)

## 2014-01-23 LAB — BASIC METABOLIC PANEL WITH GFR
BUN: 18 mg/dL (ref 6–23)
CALCIUM: 9.3 mg/dL (ref 8.4–10.5)
CHLORIDE: 101 meq/L (ref 96–112)
CO2: 24 meq/L (ref 19–32)
Creat: 1.06 mg/dL (ref 0.50–1.35)
GFR, Est Non African American: >89 mL/min
GLUCOSE: 89 mg/dL (ref 70–99)
Potassium: 4.1 mEq/L (ref 3.5–5.3)
SODIUM: 136 meq/L (ref 135–145)

## 2014-01-23 LAB — TSH: TSH: 0.929 u[IU]/mL (ref 0.350–4.500)

## 2014-01-23 LAB — TESTOSTERONE: TESTOSTERONE: 410 ng/dL (ref 300–890)

## 2014-01-23 LAB — MAGNESIUM: Magnesium: 1.9 mg/dL (ref 1.5–2.5)

## 2014-01-23 NOTE — Progress Notes (Signed)
Assessment and Plan:  Hypertension: Continue medication, monitor blood pressure at home. Continue DASH diet.  Reminder to go to the ER if any CP, SOB, nausea, dizziness, severe HA, changes vision/speech, left arm numbness and tingling, and jaw pain. Cholesterol: Continue diet and exercise. Check cholesterol.  Pre-diabetes-Continue diet and exercise. Check A1C Vitamin D Def- check level and continue medications.  Obesity with co morbidities- long discussion about weight loss, diet, and exercise Sleep apnea- given information about dental medicine Hypogonadism- continue to monitor, continue weight loss    Continue diet and meds as discussed. Further disposition pending results of labs.  HPI 38 y.o. male  presents for 3 month follow up45 with hypertension, hyperlipidemia, prediabetes and vitamin D. His blood pressure has been controlled at home, today their BP is BP: 110/72 mmHg He does not workout. He denies chest pain, shortness of breath, dizziness.  He is on cholesterol medication, pravastatin 20 M,W,F and 10mg  other days and denies myalgias. His cholesterol is at goal, less than 100. The cholesterol last visit was:   Lab Results  Component Value Date   CHOL 174 09/21/2013   HDL 42 09/21/2013   LDLCALC 91 09/21/2013   TRIG 204* 09/21/2013   CHOLHDL 4.1 09/21/2013  He has been working on diet and exercise for prediabetes, and denies paresthesia of the feet, polydipsia, polyuria and visual disturbances. Last A1C in the office was:  Lab Results  Component Value Date   HGBA1C 5.8* 09/21/2013  Patient is on Vitamin D supplement.   Lab Results  Component Value Date   VD25OH 2671 09/21/2013  He has GERD and in on zantac for this, he has allergies and possible asthma versus GERD, on Singulair/claritin and albuterol rarely.  BMI is Body mass index is 35.06 kg/(m^2)., he is working on diet and exercise. He does snore and has fatigue, has has sleep study 8 years ago that was +, does not wear CPAP  due to intolerance with skin. Interested in mouth piece. He also has testosterone def but declines treatment, is trying weight loss but with holidays it has been hard.  Wt Readings from Last 3 Encounters:  01/23/14 234 lb (106.142 kg)  09/21/13 233 lb (105.688 kg)  06/14/13 235 lb (106.595 kg)    Current Medications:  Current Outpatient Prescriptions on File Prior to Visit  Medication Sig Dispense Refill  . albuterol (PROAIR HFA) 108 (90 BASE) MCG/ACT inhaler Inhale into the lungs as needed for wheezing or shortness of breath.    Marland Kitchen. aspirin 81 MG tablet Take 81 mg by mouth daily.    . B Complex-C (SUPER B COMPLEX PO) Take by mouth daily.    . Cholecalciferol (VITAMIN D-3) 5000 UNITS TABS Take 5,000 Units by mouth daily.    Marland Kitchen. loratadine (CLARITIN) 10 MG tablet Take 10 mg by mouth every morning.    . Magnesium 250 MG TABS Take 250 mg by mouth every morning.    . Melatonin 3 MG TABS Take 6 mg by mouth at bedtime as needed (for sleep).    . montelukast (SINGULAIR) 10 MG tablet TAKE 1 TABLET DAILY FOR ALLERGY 90 tablet 3  . OVER THE COUNTER MEDICATION Take 2 capsules by mouth daily. zyflamend - spice mixture as an antiinflammatory    . OVER THE COUNTER MEDICATION Take 1-2 capsules by mouth daily as needed (for anxiety). True calm    . OVER THE COUNTER MEDICATION 2 tablets daily. Microlactin    . OVER THE COUNTER MEDICATION 2 tablets daily  as needed. Quercetin with Bromelein    . OVER THE COUNTER MEDICATION at bedtime. Cherry tart    . pravastatin (PRAVACHOL) 20 MG tablet Take 1 tablet (20 mg total) by mouth every evening. 90 tablet 1  . ranitidine (ZANTAC) 150 MG tablet Take 150 mg by mouth 2 (two) times daily.    . Zinc 50 MG TABS Take 50 mg by mouth daily.     No current facility-administered medications on file prior to visit.   Medical History:  Past Medical History  Diagnosis Date  . Hyperlipidemia   . Other abnormal glucose   . GERD (gastroesophageal reflux disease)   . Allergic  rhinitis, cause unspecified    Allergies: No Known Allergies   Review of Systems:  Review of Systems  Constitutional: Positive for malaise/fatigue. Negative for fever, chills, weight loss and diaphoresis.  HENT: Negative.   Eyes: Negative.   Respiratory: Negative.   Cardiovascular: Negative.   Gastrointestinal: Negative.   Genitourinary: Negative.   Musculoskeletal: Negative.   Skin: Negative.   Neurological: Negative.  Negative for weakness.       + snoring  Endo/Heme/Allergies: Negative.   Psychiatric/Behavioral: Negative.     Family history- Review and unchanged Social history- Review and unchanged Physical Exam: BP 110/72 mmHg  Pulse 60  Temp(Src) 97.9 F (36.6 C)  Resp 16  Ht 5' 8.5" (1.74 m)  Wt 234 lb (106.142 kg)  BMI 35.06 kg/m2 Wt Readings from Last 3 Encounters:  01/23/14 234 lb (106.142 kg)  09/21/13 233 lb (105.688 kg)  06/14/13 235 lb (106.595 kg)   General Appearance: Well nourished, in no apparent distress. Eyes: PERRLA, EOMs, conjunctiva no swelling or erythema Sinuses: No Frontal/maxillary tenderness ENT/Mouth: Ext aud canals clear, TMs without erythema, bulging. No erythema, swelling, or exudate on post pharynx.  Tonsils not swollen or erythematous. Hearing normal.  Neck: Supple, thyroid normal.  Crowded mouth Respiratory: Respiratory effort normal, BS equal bilaterally without rales, rhonchi, wheezing or stridor.  Cardio: RRR with no MRGs. Brisk peripheral pulses without edema.  Abdomen: Soft, + BS, obese  Non tender, no guarding, rebound, hernias, masses. Lymphatics: Non tender without lymphadenopathy.  Musculoskeletal: Full ROM, 5/5 strength, normal gait.  Skin: Warm, dry without rashes, lesions, ecchymosis.  Neuro: Cranial nerves intact. Normal muscle tone, no cerebellar symptoms. Sensation intact.  Psych: Awake and oriented X 3, normal affect, Insight and Judgment appropriate.    [name], PA-C 8:49 AM [facility]  Internal Medicine

## 2014-01-23 NOTE — Patient Instructions (Signed)
Can call Dr. Toni Arthurs to get evaluated for dental sleep appliance. # [phone]  OR you can try Dr. Reatha Armour in [facility] # [phone]  We will send notes. Call and get price quote on both.   Benefiber is good for constipation/diarrhea/irritable bowel syndrome, it helps with weight loss and can help lower your bad cholesterol. Please do 1-2 TBSP in the morning in water, coffee, or tea. It can take up to a month before you can see a difference with your bowel movements. It is cheapest from [employer], [employer], [employer].   We want weight loss that will last so you should lose 1-2 pounds a week.  THAT IS IT! Please pick THREE things a month to change. Once it is a habit check off the item. Then pick another three items off the list to become habits.  If you are already doing a habit on the list GREAT!  Cross that item off! o Don't drink your calories. Ie, alcohol, soda, fruit juice, and sweet tea.  o Drink more water. Drink a glass when you feel hungry or before each meal.  o Eat breakfast - Complex carb and protein (likeDannon light and fit yogurt, oatmeal, fruit, eggs, [place] bacon). o Measure your cereal.  Eat no more than one cup a day. (ie [place]) o Eat an apple a day. o Add a vegetable a day. o Try a new vegetable a month. o Use Pam! Stop using oil or butter to cook. o Don't finish your plate or use smaller plates. o Share your dessert. o Eat sugar free Jello for dessert or frozen grapes. o Don't eat 2-3 hours before bed. o Switch to whole wheat bread, pasta, and brown rice. o Make healthier choices when you eat out. No fries! o Pick baked chicken, NOT fried. o Don't forget to SLOW DOWN when you eat. It is not going anywhere.  o Take the stairs. o Park far away in the parking lot o [employer] (or weights) for 10 minutes while watching TV. o Walk at work for 10 minutes during break. o Walk outside 1 time a week with your friend, kids, dog, or significant other. o Start a  walking group at church. o Walk the mall as much as you can tolerate.  o Keep a food diary. o Weigh yourself daily. o Walk for 15 minutes 3 days per week. o Cook at home more often and eat out less.  If life happens and you go back to old habits, it is okay.  Just start over. You can do it!   If you experience chest pain, get short of breath, or tired during the exercise, please stop immediately and inform your doctor.       Bad carbs also include fruit juice, alcohol, and sweet tea. These are empty calories that do not signal to your brain that you are full.   Please remember the good carbs are still carbs which convert into sugar. So please measure them out no more than 1/2-1 cup of rice, oatmeal, pasta, and beans  Veggies are however free foods! Pile them on.   Not all fruit is created equal. Please see the list below, the fruit at the bottom is higher in sugars than the fruit at the top. Please avoid all dried fruits.     Ways to cut 100 calories  1. Eat your eggs with hot sauce OR salsa instead of cheese.  Eggs are great for breakfast, but  many people consider eggs and cheese to be BFFs. Instead of cheese-1 oz. of cheddar has 114 calories-top your eggs with hot sauce, which contains no calories and helps with satiety and metabolism. Salsa is also a great option!!  2. Top your toast, waffles or pancakes with mashed berries instead of jelly or syrup. Half a cup of berries-fresh, frozen or thawed-has about 40 calories, compared with 2 tbsp. of maple syrup or jelly, which both have about 100 calories. The berries will also give you a good punch of fiber, which helps keep you full and satisfied and won't spike blood sugar quickly like the jelly or syrup. 3. Swap the non-fat latte for black coffee with a splash of half-and-half. Contrary to its name, that non-fat latte has 130 calories and a startling 19g of carbohydrates per 16 oz. serving. Replacing that 'light' drinkable dessert with a  black coffee with a splash of half-and-half saves you more than 100 calories per 16 oz. serving. 4. Sprinkle salads with freeze-dried raspberries instead of dried cranberries. If you want a sweet addition to your nutritious salad, stay away from dried cranberries. They have a whopping 130 calories per  cup and 30g carbohydrates. Instead, sprinkle freeze-dried raspberries guilt-free and save more than 100 calories per  cup serving, adding 3g of belly-filling fiber. 5. Go for mustard in place of mayo on your sandwich. Mustard can add really nice flavor to any sandwich, and there are tons of varieties, from spicy to honey. A serving of mayo is 95 calories, versus 10 calories in a serving of mustard. 6. Choose a DIY salad dressing instead of the store-bought kind. Mix Dijon or whole grain mustard with low-fat Kefir or red wine vinegar and garlic. 7. Use hummus as a spread instead of a dip. Use hummus as a spread on a high-fiber cracker or tortilla with a sandwich and save on calories without sacrificing taste. 8. Pick just one salad "accessory." Salad isn't automatically a calorie winner. It's easy to over-accessorize with toppings. Instead of topping your salad with nuts, avocado and cranberries (all three will clock in at 313 calories), just pick one. The next day, choose a different accessory, which will also keep your salad interesting. You don't wear all your jewelry every day, right? 9. Ditch the white pasta in favor of spaghetti squash. One cup of cooked spaghetti squash has about 40 calories, compared with traditional spaghetti, which comes with more than 200. Spaghetti squash is also nutrient-dense. It's a good source of fiber and Vitamins A and C, and it can be eaten just like you would eat pasta-with a great tomato sauce and [place] meatballs or with pesto, tofu and spinach, for example. 10. Dress up your chili, soups and stews with non-fat [place] yogurt instead of sour cream. Just a 'dollop' of  sour cream can set you back 115 calories and a whopping 12g of fat-seven of which are of the artery-clogging variety. Added bonus: [place] yogurt is packed with muscle-building protein, calcium and B Vitamins. 11. Mash cauliflower instead of mashed potatoes. One cup of traditional mashed potatoes-in all their creamy goodness-has more than 200 calories, compared to mashed cauliflower, which you can typically eat for less than 100 calories per 1 cup serving. Cauliflower is a great source of the antioxidant indole-3-carbinol (I3C), which may help reduce the risk of some cancers, like breast cancer. 12. Ditch the ice cream sundae in favor of a [place] yogurt parfait. Instead of a cup of ice cream or fro-yo for  dessert, try 1 cup of nonfat Greek yogurt topped with fresh berries and a sprinkle of cacao nibs. Both toppings are packed with antioxidants, which can help reduce cellular inflammation and oxidative damage. And the comparison is a no-brainer: One cup of ice cream has about 275 calories; one cup of frozen yogurt has about 230; and a cup of Greek yogurt has just 130, plus twice the protein, so you're less likely to return to the freezer for a second helping. 13. Put olive oil in a spray container instead of using it directly from the bottle. Each tablespoon of olive oil is 120 calories and 15g of fat. Use a mister instead of pouring it straight into the pan or onto a salad. This allows for portion control and will save you more than 100 calories. 14. When baking, substitute canned pumpkin for butter or oil. Canned pumpkin-not pumpkin pie mix-is loaded with Vitamin A, which is important for skin and eye health, as well as immunity. And the comparisons are pretty crazy:  cup of canned pumpkin has about 40 calories, compared to butter or oil, which has more than 800 calories. Yes, 800 calories. Applesauce and mashed banana can also serve as good substitutions for butter or oil, usually in a 1:1 ratio. 15. Top  casseroles with high-fiber cereal instead of breadcrumbs. Breadcrumbs are typically made with white bread, while breakfast cereals contain 5-9g of fiber per serving. Not only will you save more than 150 calories per  cup serving, the swap will also keep you more full and you'll get a metabolism boost from the added fiber. 16. Snack on pistachios instead of macadamia nuts. Believe it or not, you get the same amount of calories from 35 pistachios (100 calories) as you would from only five macadamia nuts. 17. Chow down on kale chips rather than potato chips. This is my favorite 'don't knock it 'till you try it' swap. Kale chips are so easy to make at home, and you can spice them up with a little grated parmesan or chili powder. Plus, they're a mere fraction of the calories of potato chips, but with the same crunch factor we crave so often. 18. Add seltzer and some fruit slices to your cocktail instead of soda or fruit juice. One cup of soda or fruit juice can pack on as much as 140 calories. Instead, use seltzer and fruit slices. The fruit provides valuable phytochemicals, such as flavonoids and anthocyanins, which help to combat cancer and stave off the aging process.  Recommendations For Diabetic/Prediabetic Patients:   -  Take medications as prescribed  -  Recommend Dr Francis DowseJoel Fuhrman's book "The End of Diabetes "  And "The End of Dieting"- Can get at  www.Amazon.com and encourage also get the Audio CD book  - AVOID Animal products, ie. Meat - red/white, Poultry and Dairy/especially cheese - Exercise at least 5 times a week for 30 minutes or preferably daily.  - No Smoking - Drink less than 2 drinks a day.  - Monitor your feet for sores - Have yearly Eye Exams - Recommend annual Flu vaccine  - Recommend Pneumovax and Prevnar vaccines - Shingles Vaccine (Zostavax) if over 37 y.o.  Goals:   - BMI less than 24 - Fasting sugar less than 130 or less than 150 if tapering medicines to lose weight    - Systolic BP less than 130  - Diastolic BP less than 80 - Bad LDL Cholesterol less than 70 - Triglycerides less than 150

## 2014-01-24 ENCOUNTER — Encounter: Payer: Self-pay | Admitting: Physician Assistant

## 2014-01-24 LAB — VITAMIN D 25 HYDROXY (VIT D DEFICIENCY, FRACTURES): VIT D 25 HYDROXY: 47 ng/mL (ref 30–100)

## 2014-01-24 LAB — INSULIN, FASTING: INSULIN FASTING, SERUM: 6.2 u[IU]/mL (ref 2.0–19.6)

## 2014-04-16 ENCOUNTER — Encounter: Payer: Self-pay | Admitting: *Deleted

## 2014-05-19 ENCOUNTER — Ambulatory Visit (INDEPENDENT_AMBULATORY_CARE_PROVIDER_SITE_OTHER): Payer: BLUE CROSS/BLUE SHIELD | Admitting: Internal Medicine

## 2014-05-19 ENCOUNTER — Ambulatory Visit: Payer: Self-pay | Admitting: Physician Assistant

## 2014-05-19 VITALS — BP 114/62 | HR 76 | Temp 98.0°F | Resp 16 | Ht 68.5 in | Wt 232.0 lb

## 2014-05-19 DIAGNOSIS — R7309 Other abnormal glucose: Secondary | ICD-10-CM

## 2014-05-19 DIAGNOSIS — R7303 Prediabetes: Secondary | ICD-10-CM

## 2014-05-19 DIAGNOSIS — E669 Obesity, unspecified: Secondary | ICD-10-CM

## 2014-05-19 DIAGNOSIS — Z79899 Other long term (current) drug therapy: Secondary | ICD-10-CM

## 2014-05-19 DIAGNOSIS — E559 Vitamin D deficiency, unspecified: Secondary | ICD-10-CM

## 2014-05-19 DIAGNOSIS — E782 Mixed hyperlipidemia: Secondary | ICD-10-CM

## 2014-05-19 LAB — BASIC METABOLIC PANEL WITH GFR
BUN: 14 mg/dL (ref 6–23)
CHLORIDE: 102 meq/L (ref 96–112)
CO2: 24 meq/L (ref 19–32)
CREATININE: 1.03 mg/dL (ref 0.50–1.35)
Calcium: 9.4 mg/dL (ref 8.4–10.5)
GFR, Est African American: >89 mL/min
GFR, Est Non African American: >89 mL/min
GLUCOSE: 91 mg/dL (ref 70–99)
POTASSIUM: 4.4 meq/L (ref 3.5–5.3)
SODIUM: 140 meq/L (ref 135–145)

## 2014-05-19 LAB — CBC WITH DIFFERENTIAL/PLATELET
BASOS ABS: 0 10*3/uL (ref 0.0–0.1)
Basophils Relative: 0 % (ref 0–1)
Eosinophils Absolute: 0.1 10*3/uL (ref 0.0–0.7)
Eosinophils Relative: 2 % (ref 0–5)
HEMATOCRIT: 39.6 % (ref 39.0–52.0)
HEMOGLOBIN: 13.2 g/dL (ref 13.0–17.0)
Lymphocytes Relative: 39 % (ref 12–46)
Lymphs Abs: 2.1 10*3/uL (ref 0.7–4.0)
MCH: 28 pg (ref 26.0–34.0)
MCHC: 33.3 g/dL (ref 30.0–36.0)
MCV: 84.1 fL (ref 78.0–100.0)
MONOS PCT: 12 % (ref 3–12)
MPV: 9.8 fL (ref 8.6–12.4)
Monocytes Absolute: 0.6 10*3/uL (ref 0.1–1.0)
NEUTROS PCT: 47 % (ref 43–77)
Neutro Abs: 2.5 10*3/uL (ref 1.7–7.7)
Platelets: 210 10*3/uL (ref 150–400)
RBC: 4.71 MIL/uL (ref 4.22–5.81)
RDW: 14 % (ref 11.5–15.5)
WBC: 5.4 10*3/uL (ref 4.0–10.5)

## 2014-05-19 LAB — HEPATIC FUNCTION PANEL
ALBUMIN: 4.5 g/dL (ref 3.5–5.2)
ALK PHOS: 56 U/L (ref 39–117)
ALT: 40 U/L (ref 0–53)
AST: 33 U/L (ref 0–37)
BILIRUBIN TOTAL: 0.4 mg/dL (ref 0.2–1.2)
Bilirubin, Direct: 0.1 mg/dL (ref 0.0–0.3)
Indirect Bilirubin: 0.3 mg/dL (ref 0.2–1.2)
TOTAL PROTEIN: 7.5 g/dL (ref 6.0–8.3)

## 2014-05-19 LAB — LIPID PANEL
CHOL/HDL RATIO: 4.9 ratio
Cholesterol: 182 mg/dL (ref 0–200)
HDL: 37 mg/dL — ABNORMAL LOW (ref >=40)
LDL Cholesterol: 94 mg/dL (ref 0–99)
TRIGLYCERIDES: 257 mg/dL — AB (ref <150)
VLDL: 51 mg/dL — ABNORMAL HIGH (ref 0–40)

## 2014-05-19 LAB — MAGNESIUM: Magnesium: 2.1 mg/dL (ref 1.5–2.5)

## 2014-05-19 LAB — HEMOGLOBIN A1C
Hgb A1c MFr Bld: 5.9 % — ABNORMAL HIGH (ref <5.7)
Mean Plasma Glucose: 123 mg/dL — ABNORMAL HIGH (ref <117)

## 2014-05-19 MED ORDER — MELOXICAM 7.5 MG PO TABS: 7.5000 mg | ORAL_TABLET | Freq: Every day | ORAL | Status: DC

## 2014-05-19 NOTE — Progress Notes (Signed)
Patient ID: Adrian MayhewJohn J Francis, male   DOB: March 12, 1975, 39 y.o.   MRN: [id number]  Assessment and Plan:  Hypertension:  -Continue medication,  -monitor blood pressure at home.  -Continue DASH diet.   -Reminder to go to the ER if any CP, SOB, nausea, dizziness, severe HA, changes vision/speech, left arm numbness and tingling, and jaw pain.  Cholesterol: -Continue diet and exercise.  -Check cholesterol.   Pre-diabetes: -Continue diet and exercise.  -Check A1C  Vitamin D Def: -check level -continue medications.   Masseter Muscle Spasm -questionable bruxism vs. Stress tension -massage -warm compresses -mobic x 2 weeks and then prn  Continue diet and meds as discussed. Further disposition pending results of labs.  HPI 39 y.o. male  presents for 3 month follow up with hypertension, hyperlipidemia, prediabetes and vitamin D.   His blood pressure has been controlled at home, today their BP is BP: 114/62 mmHg.   He does workout.  He is trying to walk 10-30 minutes during the work day.   He denies chest pain, shortness of breath, dizziness.   He is on cholesterol medication and denies myalgias. His cholesterol is not at goal. The cholesterol last visit was:   Lab Results  Component Value Date   CHOL 189 01/23/2014   HDL 40 01/23/2014   LDLCALC 112* 01/23/2014   TRIG 183* 01/23/2014   CHOLHDL 4.7 01/23/2014     He has been working on diet and exercise for prediabetes, and denies foot ulcerations, hyperglycemia, hypoglycemia , increased appetite, nausea, paresthesia of the feet, polydipsia, polyuria, visual disturbances, vomiting and weight loss. Last A1C in the office was:  Lab Results  Component Value Date   HGBA1C 5.8* 01/23/2014  He reports that he is doing a lot of cooking at home.  He does report that he is trying to use a lot more veggies in his meals and also reports that he does an entree with carrots and a homemade trail mix.  He does eat a lot of chili, spaghetti, steaks.     Patient is on Vitamin D supplement.  Lab Results  Component Value Date   VD25OH 47 01/23/2014       Current Medications:  Current Outpatient Prescriptions on File Prior to Visit  Medication Sig Dispense Refill  . albuterol (PROAIR HFA) 108 (90 BASE) MCG/ACT inhaler Inhale into the lungs as needed for wheezing or shortness of breath.    . B Complex-C (SUPER B COMPLEX PO) Take by mouth daily.    . Cholecalciferol (VITAMIN D-3) 5000 UNITS TABS Take 5,000 Units by mouth daily.    Marland Kitchen. loratadine (CLARITIN) 10 MG tablet Take 10 mg by mouth every morning.    . Magnesium 500 MG TABS Take 500 mg by mouth daily.    . montelukast (SINGULAIR) 10 MG tablet TAKE 1 TABLET DAILY FOR ALLERGY 90 tablet 3  . Omega-3 Fatty Acids (FISH OIL PO) Take by mouth. Two daily    . OVER THE COUNTER MEDICATION Take 2 capsules by mouth daily. zyflamend - spice mixture as an antiinflammatory    . OVER THE COUNTER MEDICATION Take 1-2 capsules by mouth daily as needed (for anxiety). True calm    . OVER THE COUNTER MEDICATION 2 tablets daily. Microlactin    . OVER THE COUNTER MEDICATION 2 tablets daily as needed. Quercetin with Bromelein    . OVER THE COUNTER MEDICATION at bedtime. Cherry tart    . pravastatin (PRAVACHOL) 20 MG tablet Take 1 tablet (20 mg total)  by mouth every evening. 90 tablet 1  . ranitidine (ZANTAC) 150 MG tablet Take 150 mg by mouth 2 (two) times daily.    . Zinc 50 MG TABS Take 50 mg by mouth daily.     No current facility-administered medications on file prior to visit.    Medical History:  Past Medical History  Diagnosis Date  . Hyperlipidemia   . Other abnormal glucose   . GERD (gastroesophageal reflux disease)   . Allergic rhinitis, cause unspecified     Allergies: No Known Allergies   Review of Systems:  Review of Systems  Constitutional: Negative for fever, chills and malaise/fatigue.  HENT: Negative for congestion, ear discharge and sore throat.   Eyes: Negative.    Respiratory: Negative for cough, shortness of breath and wheezing.   Cardiovascular: Negative for chest pain, palpitations and leg swelling.  Gastrointestinal: Negative for heartburn, nausea, vomiting, diarrhea, constipation, blood in stool and melena.  Genitourinary: Negative.   Musculoskeletal: Negative.   Neurological: Negative for dizziness, sensory change and headaches.  Psychiatric/Behavioral: Negative for depression. The patient is not nervous/anxious and does not have insomnia.     Family history- Review and unchanged  Social history- Review and unchanged  Physical Exam: BP 114/62 mmHg  Pulse 76  Temp(Src) 98 F (36.7 C) (Temporal)  Resp 16  Ht 5' 8.5" (1.74 m)  Wt 232 lb (105.235 kg)  BMI 34.76 kg/m2 Wt Readings from Last 3 Encounters:  05/19/14 232 lb (105.235 kg)  01/23/14 234 lb (106.142 kg)  09/21/13 233 lb (105.688 kg)    General Appearance: Well nourished well developed, in no apparent distress. Eyes: PERRLA, EOMs, conjunctiva no swelling or erythema ENT/Mouth: Ear canals normal without obstruction, swelling, erythma, discharge.  TMs normal bilaterally.  Oropharynx moist, clear, without exudate, or postoropharyngeal swelling. Neck: Supple, thyroid normal,no cervical adenopathy. Mild right sided masseter muscle tenderness to palpation. Respiratory: Respiratory effort normal, Breath sounds clear A&P without rhonchi, wheeze, or rale.  No retractions, no accessory usage. Cardio: RRR with no MRGs. Brisk peripheral pulses without edema.  Abdomen: Soft, + BS,  Non tender, no guarding, rebound, hernias, masses. Musculoskeletal: Full ROM, 5/5 strength, Normal gait Skin: Warm, dry without rashes, lesions, ecchymosis.  Neuro: Awake and oriented X 3, Cranial nerves intact. Normal muscle tone, no cerebellar symptoms. Psych: Normal affect, Insight and Judgment appropriate.    FORCUCCI, Kimberla Driskill, PA-C 8:47 AM [facility]

## 2014-05-19 NOTE — Patient Instructions (Signed)

## 2014-05-20 LAB — INSULIN, RANDOM: Insulin: 7.1 u[IU]/mL (ref 2.0–19.6)

## 2014-05-22 ENCOUNTER — Encounter: Payer: Self-pay | Admitting: Internal Medicine

## 2014-05-22 ENCOUNTER — Other Ambulatory Visit: Payer: Self-pay | Admitting: Internal Medicine

## 2014-05-22 MED ORDER — FENOFIBRATE 145 MG PO TABS: 145.0000 mg | ORAL_TABLET | Freq: Every day | ORAL | Status: DC

## 2014-05-22 NOTE — Progress Notes (Signed)
Spoke with patient's wife and informed her of instructions.  Wife will call or MyChart if any symptoms develop or with any concerns.

## 2014-05-23 LAB — VITAMIN D 1,25 DIHYDROXY
VITAMIN D3 1, 25 (OH): 61 pg/mL
Vitamin D 1, 25 (OH)2 Total: 61 pg/mL (ref 18–72)
Vitamin D2 1, 25 (OH)2: <8 pg/mL

## 2014-05-24 ENCOUNTER — Other Ambulatory Visit: Payer: Self-pay | Admitting: Emergency Medicine

## 2014-06-30 ENCOUNTER — Encounter: Payer: Self-pay | Admitting: Internal Medicine

## 2014-07-03 ENCOUNTER — Other Ambulatory Visit: Payer: Self-pay

## 2014-07-03 MED ORDER — FENOFIBRATE 145 MG PO TABS: 145.0000 mg | ORAL_TABLET | Freq: Every day | ORAL | Status: DC

## 2014-09-22 ENCOUNTER — Other Ambulatory Visit: Payer: Self-pay | Admitting: Emergency Medicine

## 2014-09-25 ENCOUNTER — Encounter: Payer: Self-pay | Admitting: Emergency Medicine

## 2014-10-02 ENCOUNTER — Encounter: Payer: Self-pay | Admitting: Physician Assistant

## 2014-10-02 ENCOUNTER — Ambulatory Visit (INDEPENDENT_AMBULATORY_CARE_PROVIDER_SITE_OTHER): Payer: BLUE CROSS/BLUE SHIELD | Admitting: Physician Assistant

## 2014-10-02 VITALS — BP 112/66 | HR 60 | Temp 98.4°F | Resp 16 | Ht 68.5 in | Wt 228.2 lb

## 2014-10-02 DIAGNOSIS — R001 Bradycardia, unspecified: Secondary | ICD-10-CM | POA: Insufficient documentation

## 2014-10-02 DIAGNOSIS — K219 Gastro-esophageal reflux disease without esophagitis: Secondary | ICD-10-CM

## 2014-10-02 DIAGNOSIS — E291 Testicular hypofunction: Secondary | ICD-10-CM

## 2014-10-02 DIAGNOSIS — I10 Essential (primary) hypertension: Secondary | ICD-10-CM

## 2014-10-02 DIAGNOSIS — Z0000 Encounter for general adult medical examination without abnormal findings: Secondary | ICD-10-CM

## 2014-10-02 DIAGNOSIS — Z79899 Other long term (current) drug therapy: Secondary | ICD-10-CM

## 2014-10-02 DIAGNOSIS — E559 Vitamin D deficiency, unspecified: Secondary | ICD-10-CM

## 2014-10-02 DIAGNOSIS — J309 Allergic rhinitis, unspecified: Secondary | ICD-10-CM

## 2014-10-02 DIAGNOSIS — E669 Obesity, unspecified: Secondary | ICD-10-CM

## 2014-10-02 DIAGNOSIS — Z1389 Encounter for screening for other disorder: Secondary | ICD-10-CM

## 2014-10-02 DIAGNOSIS — Z0001 Encounter for general adult medical examination with abnormal findings: Secondary | ICD-10-CM

## 2014-10-02 DIAGNOSIS — R7303 Prediabetes: Secondary | ICD-10-CM

## 2014-10-02 DIAGNOSIS — E782 Mixed hyperlipidemia: Secondary | ICD-10-CM

## 2014-10-02 LAB — CBC WITH DIFFERENTIAL/PLATELET
BASOS PCT: 0 % (ref 0–1)
Basophils Absolute: 0 10*3/uL (ref 0.0–0.1)
EOS ABS: 0.1 10*3/uL (ref 0.0–0.7)
Eosinophils Relative: 2 % (ref 0–5)
HCT: 39.2 % (ref 39.0–52.0)
Hemoglobin: 13.2 g/dL (ref 13.0–17.0)
Lymphocytes Relative: 38 % (ref 12–46)
Lymphs Abs: 2 10*3/uL (ref 0.7–4.0)
MCH: 28.4 pg (ref 26.0–34.0)
MCHC: 33.7 g/dL (ref 30.0–36.0)
MCV: 84.3 fL (ref 78.0–100.0)
MONO ABS: 0.7 10*3/uL (ref 0.1–1.0)
MPV: 9.6 fL (ref 8.6–12.4)
Monocytes Relative: 13 % — ABNORMAL HIGH (ref 3–12)
Neutro Abs: 2.4 10*3/uL (ref 1.7–7.7)
Neutrophils Relative %: 47 % (ref 43–77)
PLATELETS: 241 10*3/uL (ref 150–400)
RBC: 4.65 MIL/uL (ref 4.22–5.81)
RDW: 13.9 % (ref 11.5–15.5)
WBC: 5.2 10*3/uL (ref 4.0–10.5)

## 2014-10-02 LAB — HEPATIC FUNCTION PANEL
ALT: 29 U/L (ref 9–46)
AST: 27 U/L (ref 10–40)
Albumin: 4.7 g/dL (ref 3.6–5.1)
Alkaline Phosphatase: 34 U/L — ABNORMAL LOW (ref 40–115)
BILIRUBIN INDIRECT: 0.3 mg/dL (ref 0.2–1.2)
BILIRUBIN TOTAL: 0.4 mg/dL (ref 0.2–1.2)
Bilirubin, Direct: 0.1 mg/dL (ref <=0.2)
TOTAL PROTEIN: 7.2 g/dL (ref 6.1–8.1)

## 2014-10-02 LAB — LIPID PANEL
CHOLESTEROL: 168 mg/dL (ref 125–200)
HDL: 48 mg/dL (ref >=40)
LDL Cholesterol: 101 mg/dL (ref <130)
TRIGLYCERIDES: 94 mg/dL (ref <150)
Total CHOL/HDL Ratio: 3.5 Ratio (ref <=5.0)
VLDL: 19 mg/dL (ref <30)

## 2014-10-02 LAB — HEMOGLOBIN A1C
Hgb A1c MFr Bld: 5.7 % — ABNORMAL HIGH (ref <5.7)
Mean Plasma Glucose: 117 mg/dL — ABNORMAL HIGH (ref <117)

## 2014-10-02 LAB — BASIC METABOLIC PANEL WITH GFR
BUN: 18 mg/dL (ref 7–25)
CO2: 27 mmol/L (ref 20–31)
Calcium: 9.5 mg/dL (ref 8.6–10.3)
Chloride: 103 mmol/L (ref 98–110)
Creat: 1.23 mg/dL (ref 0.60–1.35)
GFR, EST AFRICAN AMERICAN: 87 mL/min (ref >=60)
GFR, EST NON AFRICAN AMERICAN: 75 mL/min (ref >=60)
GLUCOSE: 91 mg/dL (ref 65–99)
Potassium: 5 mmol/L (ref 3.5–5.3)
Sodium: 139 mmol/L (ref 135–146)

## 2014-10-02 LAB — URINALYSIS, ROUTINE W REFLEX MICROSCOPIC
BILIRUBIN URINE: NEGATIVE
Glucose, UA: NEGATIVE
HGB URINE DIPSTICK: NEGATIVE
Ketones, ur: NEGATIVE
Leukocytes, UA: NEGATIVE
Nitrite: NEGATIVE
PROTEIN: NEGATIVE
Specific Gravity, Urine: 1.004 (ref 1.001–1.035)
pH: 7 (ref 5.0–8.0)

## 2014-10-02 LAB — MICROALBUMIN / CREATININE URINE RATIO: Creatinine, Urine: 18.2 mg/dL

## 2014-10-02 LAB — TSH: TSH: 1.433 u[IU]/mL (ref 0.350–4.500)

## 2014-10-02 LAB — MAGNESIUM: MAGNESIUM: 2.1 mg/dL (ref 1.5–2.5)

## 2014-10-02 LAB — TESTOSTERONE: TESTOSTERONE: 254 ng/dL — AB (ref 300–890)

## 2014-10-02 NOTE — Progress Notes (Signed)
Complete Physical  Assessment and Plan: 1. Prediabetes Discussed general issues about diabetes pathophysiology and management., Educational material distributed., Suggested low cholesterol diet., Encouraged aerobic exercise., Discussed foot care., Reminded to get yearly retinal exam. - CBC with Differential/Platelet - BASIC METABOLIC PANEL WITH GFR - Hepatic function panel - Hemoglobin A1c - Insulin, fasting - EKG 12-Lead  2. Mixed hyperlipidemia -continue medications, check lipids, decrease fatty foods, increase activity.  - Lipid panel - EKG 12-Lead  3. Obesity Obesity with co morbidities- long discussion about weight loss, diet, and exercise - TSH  4. Vitamin D deficiency - Vit D  25 hydroxy (rtn osteoporosis monitoring)  5. Medication management - Magnesium  6. Gastroesophageal reflux disease without esophagitis Continue PPI/H2 blocker, diet discussed  7. Allergic rhinitis, unspecified allergic rhinitis type Continue OTC allergy pills  8. Hypogonadism male Improved with diet, check labs.  - Testosterone  9. Encounter for general adult medical examination with abnormal findings - CBC with Differential/Platelet - BASIC METABOLIC PANEL WITH GFR - Hepatic function panel - TSH - Lipid panel - Hemoglobin A1c - Insulin, fasting - Magnesium - Vit D  25 hydroxy (rtn osteoporosis monitoring) - Urinalysis, Routine w reflex microscopic (not at [facility]) - Microalbumin / creatinine urine ratio - EKG 12-Lead - Testosterone  10. Asymptomatic Sinus bradycardia No CP, SOB, dizziness, history of syncope with tachycardia 2 years ago, normal stress test Dr. Anne Fu, does have family history of SSS/pacemakers, will monitor closely, call if any symptoms. Suggest treating OSA.   11. Screening for blood or protein in urine - Urinalysis, Routine w reflex microscopic (not at [facility]) - Microalbumin / creatinine urine ratio  Discussed med's effects and SE's. Screening labs and tests as  requested with regular follow-up as recommended. Over 40 minutes of exam, counseling, chart review and critical decision making was performed  HPI Patient presents for a complete physical.   His blood pressure has been controlled at home, today their BP is BP: 112/66 mmHg He does not workout. He denies chest pain, shortness of breath, dizziness.  His EKG shows sinus bradycardia at 48 which is new from previous EKG's, he is not on AV nodal blocking medications, he has OSA, not on CPAP due to intolerance. He did have a syncopal episode 2 years ago with exertion, had tachycardia, saw Dr. Anne Fu, had normal stress test 2 years ago.  He denies CP, SOB, dizziness, fatigue.  He is on cholesterol medication, pravastatin  3 days a week and the rest is 10 mg, on tricor and denies myalgias. His cholesterol is at goal. The cholesterol last visit was:   Lab Results  Component Value Date   CHOL 182 05/19/2014   HDL 37* 05/19/2014   LDLCALC 94 05/19/2014   TRIG 257* 05/19/2014   CHOLHDL 4.9 05/19/2014   He has been working on diet and exercise for prediabetes,  and denies paresthesia of the feet, polydipsia, polyuria and visual disturbances. Last A1C in the office was:  Lab Results  Component Value Date   HGBA1C 5.9* 05/19/2014   Patient is on Vitamin D supplement.   Lab Results  Component Value Date   VD25OH 47 01/23/2014     Last PSA was: Lab Results  Component Value Date   PSA 1.15 09/21/2013   He has GERD.  BMI is Body mass index is 34.19 kg/(m^2)., he is working on diet and exercise. Has + OSA but can not tolerate a CPAP. He also has testosterone def but declines treatment.  Wt Readings from  Last 3 Encounters:  10/02/14 228 lb 3.2 oz (103.511 kg)  05/19/14 232 lb (105.235 kg)  01/23/14 234 lb (106.142 kg)      Current Medications:  Current Outpatient Prescriptions on File Prior to Visit  Medication Sig Dispense Refill  . B Complex-C (SUPER B COMPLEX PO) Take by mouth daily.     . Cholecalciferol (VITAMIN D-3) 5000 UNITS TABS Take 5,000 Units by mouth daily.    . fenofibrate (TRICOR) 145 MG tablet Take 1 tablet (145 mg total) by mouth daily. 90 tablet 3  . loratadine (CLARITIN) 10 MG tablet Take 10 mg by mouth every morning.    . Magnesium 500 MG TABS Take 500 mg by mouth daily.    . meloxicam (MOBIC) 7.5 MG tablet Take 1 tablet (7.5 mg total) by mouth daily. 30 tablet 2  . montelukast (SINGULAIR) 10 MG tablet TAKE 1 TABLET DAILY FOR ALLERGY 90 tablet 1  . Omega-3 Fatty Acids (FISH OIL PO) Take by mouth. Two daily    . OVER THE COUNTER MEDICATION Take 2 capsules by mouth daily. zyflamend - spice mixture as an antiinflammatory    . OVER THE COUNTER MEDICATION Take 1-2 capsules by mouth daily as needed (for anxiety). True calm    . OVER THE COUNTER MEDICATION 2 tablets daily. Microlactin    . OVER THE COUNTER MEDICATION 2 tablets daily as needed. Quercetin with Bromelein    . OVER THE COUNTER MEDICATION at bedtime. Cherry tart    . pravastatin (PRAVACHOL) 20 MG tablet TAKE 1 TABLET EVERY EVENING 90 tablet 3  . ranitidine (ZANTAC) 150 MG tablet Take 150 mg by mouth 2 (two) times daily.    . Zinc 50 MG TABS Take 50 mg by mouth daily.     No current facility-administered medications on file prior to visit.   Health Maintenance:  Immunization History  Administered Date(s) Administered  . PPD Test 09/21/2013  . Tdap 09/21/2013   Tetanus: 2015 Pneumovax: Prevnar 13: Flu vaccine: Zostavax: DEXA: Colonoscopy: 2014 EGD: Eye Exam: yearly Dentist: q 6 months  Patient Care Team: Lucky Cowboy, MD as PCP - General (Internal Medicine) Elmon Else, MD as Consulting Physician (Dermatology) Jake Bathe, MD as Consulting Physician (Cardiology) Jeani Hawking, MD as Consulting Physician (Gastroenterology)  Allergies: No Known Allergies Medical History:  Past Medical History  Diagnosis Date  . Hyperlipidemia   . Other abnormal glucose   . GERD (gastroesophageal  reflux disease)   . Allergic rhinitis, cause unspecified    Surgical History:  Past Surgical History  Procedure Laterality Date  . Tonsillectomy    . Tonsillectomy and adenoidectomy     Family History:  Family History  Problem Relation Age of Onset  . Lupus Mother   . Hypertension Father   . Cancer Father     precancerous polyps  . Diabetes Maternal Grandmother   . Cancer Maternal Grandmother     melanoma  . Cancer Paternal Grandfather     colon/ prostate  . Heart disease Other   . Stroke Other    Social History:   Social History  Substance Use Topics  . Smoking status: Never Smoker   . Smokeless tobacco: None  . Alcohol Use: No   Review of Systems:  Review of Systems  Constitutional: Positive for malaise/fatigue. Negative for fever, chills, weight loss and diaphoresis.  HENT: Negative.   Eyes: Negative.   Respiratory: Negative.   Cardiovascular: Negative.   Gastrointestinal: Negative.   Genitourinary: Negative.   Musculoskeletal:  Negative.   Skin: Negative.   Neurological: Negative.  Negative for weakness.       + snoring  Endo/Heme/Allergies: Negative.   Psychiatric/Behavioral: Negative.     Physical Exam: Estimated body mass index is 34.19 kg/(m^2) as calculated from the following:   Height as of this encounter: 5' 8.5" (1.74 m).   Weight as of this encounter: 228 lb 3.2 oz (103.511 kg). BP 112/66 mmHg  Temp(Src) 98.4 F (36.9 C)  Ht 5' 8.5" (1.74 m)  Wt 228 lb 3.2 oz (103.511 kg)  BMI 34.19 kg/m2 General Appearance: Well nourished, in no apparent distress.  Eyes: PERRLA, EOMs, conjunctiva no swelling or erythema, normal fundi and vessels.  Sinuses: No Frontal/maxillary tenderness  ENT/Mouth: Ext aud canals clear, normal light reflex with TMs without erythema, bulging. Good dentition. No erythema, swelling, or exudate on post pharynx. Tonsils not swollen or erythematous. Hearing normal.  Neck: Supple, thyroid normal. No bruits  Respiratory:  Respiratory effort normal, BS equal bilaterally without rales, rhonchi, wheezing or stridor.  Cardio: RRR without murmurs, rubs or gallops. Brisk peripheral pulses without edema.  Chest: symmetric, with normal excursions and percussion.  Abdomen: Soft, nontender, no guarding, rebound, hernias, masses, or organomegaly.  Lymphatics: Non tender without lymphadenopathy.  Genitourinary:  Musculoskeletal: Full ROM all peripheral extremities,5/5 strength, and normal gait.  Skin: Warm, dry without rashes, lesions, ecchymosis. Neuro: Cranial nerves intact, reflexes equal bilaterally. Normal muscle tone, no cerebellar symptoms. Sensation intact.  Psych: Awake and oriented X 3, normal affect, Insight and Judgment appropriate.   EKG: asymptomatic sinus bradycardia with IRBBB AORTA SCAN: defer  [name] 9:13 AM [facility]

## 2014-10-02 NOTE — Patient Instructions (Signed)
I think it is possible that you have sleep apnea. It can cause interrupted sleep, headaches, frequent awakenings, fatigue, dry mouth, fast/slow heart beats, memory issues, anxiety/depression, swelling, numbness tingling hands/feet, weight gain, shortness of breath, and the list goes on. Sleep apnea needs to be ruled out because if it is left untreated it does eventually lead to abnormal heart beats, lung failure or heart failure as well as increasing the risk of heart attack and stroke. There are masks you can wear OR a mouth piece that I can give you information about. Often times though people feel MUCH better after getting treatment.   Sleep Apnea  Sleep apnea is a sleep disorder characterized by abnormal pauses in breathing while you sleep. When your breathing pauses, the level of oxygen in your blood decreases. This causes you to move out of deep sleep and into light sleep. As a result, your quality of sleep is poor, and the system that carries your blood throughout your body (cardiovascular system) experiences stress. If sleep apnea remains untreated, the following conditions can develop:  High blood pressure (hypertension).  Coronary artery disease.  Inability to achieve or maintain an erection (impotence).  Impairment of your thought process (cognitive dysfunction). There are three types of sleep apnea: 1. Obstructive sleep apnea--Pauses in breathing during sleep because of a blocked airway. 2. Central sleep apnea--Pauses in breathing during sleep because the area of the brain that controls your breathing does not send the correct signals to the muscles that control breathing. 3. Mixed sleep apnea--A combination of both obstructive and central sleep apnea.  RISK FACTORS The following risk factors can increase your risk of developing sleep apnea:  Being overweight.  Smoking.  Having narrow passages in your nose and throat.  Being of older age.  Being male.  Alcohol use.   Sedative and tranquilizer use.  Ethnicity. Among individuals younger than 35 years, African Americans are at increased risk of sleep apnea. SYMPTOMS   Difficulty staying asleep.  Daytime sleepiness and fatigue.  Loss of energy.  Irritability.  Loud, heavy snoring.  Morning headaches.  Trouble concentrating.  Forgetfulness.  Decreased interest in sex. DIAGNOSIS  In order to diagnose sleep apnea, your caregiver will perform a physical examination. Your caregiver may suggest that you take a home sleep test. Your caregiver may also recommend that you spend the night in a sleep lab. In the sleep lab, several monitors record information about your heart, lungs, and brain while you sleep. Your leg and arm movements and blood oxygen level are also recorded. TREATMENT The following actions may help to resolve mild sleep apnea:  Sleeping on your side.   Using a decongestant if you have nasal congestion.   Avoiding the use of depressants, including alcohol, sedatives, and narcotics.   Losing weight and modifying your diet if you are overweight. There also are devices and treatments to help open your airway:  Oral appliances. These are custom-made mouthpieces that shift your lower jaw forward and slightly open your bite. This opens your airway.  Devices that create positive airway pressure. This positive pressure "splints" your airway open to help you breathe better during sleep. The following devices create positive airway pressure:  Continuous positive airway pressure (CPAP) device. The CPAP device creates a continuous level of air pressure with an air pump. The air is delivered to your airway through a mask while you sleep. This continuous pressure keeps your airway open.  Nasal expiratory positive airway pressure (EPAP) device. The EPAP device  creates positive air pressure as you exhale. The device consists of single-use valves, which are inserted into each nostril and held in  place by adhesive. The valves create very little resistance when you inhale but create much more resistance when you exhale. That increased resistance creates the positive airway pressure. This positive pressure while you exhale keeps your airway open, making it easier to breath when you inhale again.  Bilevel positive airway pressure (BPAP) device. The BPAP device is used mainly in patients with central sleep apnea. This device is similar to the CPAP device because it also uses an air pump to deliver continuous air pressure through a mask. However, with the BPAP machine, the pressure is set at two different levels. The pressure when you exhale is lower than the pressure when you inhale.  Surgery. Typically, surgery is only done if you cannot comply with less invasive treatments or if the less invasive treatments do not improve your condition. Surgery involves removing excess tissue in your airway to create a wider passage way. Document Released: 12/13/2001 Document Revised: 04/19/2012 Document Reviewed: 05/01/2011 [facility] Patient Information 2015 [place], [place]. This information is not intended to replace advice given to you by your health care provider. Make sure you discuss any questions you have with your health care provider.  We want weight loss that will last so you should lose 1-2 pounds a week.  THAT IS IT! Please pick THREE things a month to change. Once it is a habit check off the item. Then pick another three items off the list to become habits.  If you are already doing a habit on the list GREAT!  Cross that item off! o Don't drink your calories. Ie, alcohol, soda, fruit juice, and sweet tea.  o Drink more water. Drink a glass when you feel hungry or before each meal.  o Eat breakfast - Complex carb and protein (likeDannon light and fit yogurt, oatmeal, fruit, eggs, [place] bacon). o Measure your cereal.  Eat no more than one cup a day. (ie [place]) o Eat an apple a day. o Add a vegetable a  day. o Try a new vegetable a month. o Use Pam! Stop using oil or butter to cook. o Don't finish your plate or use smaller plates. o Share your dessert. o Eat sugar free Jello for dessert or frozen grapes. o Don't eat 2-3 hours before bed. o Switch to whole wheat bread, pasta, and brown rice. o Make healthier choices when you eat out. No fries! o Pick baked chicken, NOT fried. o Don't forget to SLOW DOWN when you eat. It is not going anywhere.  o Take the stairs. o Park far away in the parking lot o [employer] (or weights) for 10 minutes while watching TV. o Walk at work for 10 minutes during break. o Walk outside 1 time a week with your friend, kids, dog, or significant other. o Start a walking group at church. o Walk the mall as much as you can tolerate.  o Keep a food diary. o Weigh yourself daily. o Walk for 15 minutes 3 days per week. o Cook at home more often and eat out less.  If life happens and you go back to old habits, it is okay.  Just start over. You can do it!   If you experience chest pain, get short of breath, or tired during the exercise, please stop immediately and inform your doctor.     Sick Sinus Syndrome Sick sinus syndrome  is a type of abnormal heartbeat (arrhythmia) that results from a dysfunction of the electrical pathway or "natural pacemaker" of the heart. Sick sinus syndrome may cause your heart to beat too fast, too slow, or both. Sick sinus syndrome usually develops slowly over many years and is mostly seen in people over 78 years of age. CAUSES There can be different causes of sick sinus syndrome. Some of these include:  The natural pacemaker system of the heart is abnormal or not working properly.  Coronary artery disease (CAD).  Inflammation of the heart muscle (myocarditis).  Inflammation of the sac lining the heart (pericarditis).  Medication-related causes from medicines such as beta blockers, some calcium channel blockers, or  digoxin.  Abnormal thyroid or electrolyte levels.  Family history. SYMPTOMS People with sick sinus syndrome may not always have symptoms. When symptoms are present, they may include:  Chest pain.  Shortness of breath.  Dizziness or fainting.  Feeling your heart skip beats or beating very fast. DIAGNOSIS Sick sinus syndrome can be diagnosed in a variety of ways. The following tests can help diagnose sick sinus syndrome.   An electrocardiogram (ECG). This helps doctors analyze the electrical currents of your heart and determine the type of arrhythmia you have.  Holter monitoring. You wear a Holter monitor that is connected to electrodes that are placed on your chest. The monitor records a nonstop reading of your heart rate and rhythm over a 24-hour period. Your caregiver can then look at a printout of the recording.  Event monitors are devices that record your heart rhythm. You wear an event monitor for longer than 24 hours. An event monitor can record and store data for up to 30 days.  Loop recorders are devices that record your heartbeat. They are implanted under your skin and can be left in place for several months.  Electrophysiology studies (EPS). These are done in a cardiac catheterization laboratory. A long, thin tube called a catheter is inserted in an artery in your groin and guided to your heart. Electrical impulses from your heart are sent through the catheter and mapped out. This map helps doctors find out what kind of arrhythmia you have and whether it is caused by sick sinus syndrome. TREATMENT   If you do not have any symptoms, you may not need treatment for sick sinus syndrome.  If sick sinus syndrome is caused by medications you are taking, your caregiver may need to change the medication or dosage.  Your caregiver may ask you to make lifestyle changes. You may need to exercise and change what you eat according to your caregiver's instructions.  If sick sinus  syndrome is causing a slow heartbeat (bradycardia), you may need to have a pacemaker. SEEK IMMEDIATE MEDICAL CARE IF:  You have chest pain.  You have difficulty breathing or shortness of breath.  You have a very fast heartbeat or your heart "skips" beats.  You have dizziness.  You faint or feel you are about to faint. MAKE SURE YOU:   Understand these instructions.  Will watch your condition.  Will get help right away if you are not doing well or get worse. Document Released: 12/11/2008 Document Revised: 05/09/2013 Document Reviewed: 12/11/2008 [facility] Patient Information 2015 [place], [place]. This information is not intended to replace advice given to you by your health care provider. Make sure you discuss any questions you have with your health care provider.  Marland Kitchen

## 2014-10-03 ENCOUNTER — Encounter: Payer: Self-pay | Admitting: Physician Assistant

## 2014-10-03 LAB — INSULIN, FASTING: INSULIN FASTING, SERUM: 7.8 u[IU]/mL (ref 2.0–19.6)

## 2014-10-03 LAB — VITAMIN D 25 HYDROXY (VIT D DEFICIENCY, FRACTURES): Vit D, 25-Hydroxy: 40 ng/mL (ref 30–100)

## 2015-01-12 ENCOUNTER — Ambulatory Visit (INDEPENDENT_AMBULATORY_CARE_PROVIDER_SITE_OTHER): Payer: BLUE CROSS/BLUE SHIELD | Admitting: Physician Assistant

## 2015-01-12 ENCOUNTER — Encounter: Payer: Self-pay | Admitting: Physician Assistant

## 2015-01-12 VITALS — BP 110/60 | HR 74 | Temp 97.7°F | Resp 16 | Ht 68.5 in | Wt 220.0 lb

## 2015-01-12 DIAGNOSIS — E291 Testicular hypofunction: Secondary | ICD-10-CM | POA: Diagnosis not present

## 2015-01-12 DIAGNOSIS — E669 Obesity, unspecified: Secondary | ICD-10-CM | POA: Diagnosis not present

## 2015-01-12 DIAGNOSIS — Z79899 Other long term (current) drug therapy: Secondary | ICD-10-CM | POA: Diagnosis not present

## 2015-01-12 DIAGNOSIS — R7303 Prediabetes: Secondary | ICD-10-CM

## 2015-01-12 DIAGNOSIS — E782 Mixed hyperlipidemia: Secondary | ICD-10-CM

## 2015-01-12 DIAGNOSIS — E559 Vitamin D deficiency, unspecified: Secondary | ICD-10-CM

## 2015-01-12 LAB — CBC WITH DIFFERENTIAL/PLATELET
BASOS PCT: 0 % (ref 0–1)
Basophils Absolute: 0 10*3/uL (ref 0.0–0.1)
EOS PCT: 2 % (ref 0–5)
Eosinophils Absolute: 0.1 10*3/uL (ref 0.0–0.7)
HEMATOCRIT: 40.9 % (ref 39.0–52.0)
HEMOGLOBIN: 13.8 g/dL (ref 13.0–17.0)
Lymphocytes Relative: 42 % (ref 12–46)
Lymphs Abs: 2 10*3/uL (ref 0.7–4.0)
MCH: 28.8 pg (ref 26.0–34.0)
MCHC: 33.7 g/dL (ref 30.0–36.0)
MCV: 85.4 fL (ref 78.0–100.0)
MONO ABS: 0.6 10*3/uL (ref 0.1–1.0)
MONOS PCT: 12 % (ref 3–12)
MPV: 9.5 fL (ref 8.6–12.4)
Neutro Abs: 2.1 10*3/uL (ref 1.7–7.7)
Neutrophils Relative %: 44 % (ref 43–77)
Platelets: 246 10*3/uL (ref 150–400)
RBC: 4.79 MIL/uL (ref 4.22–5.81)
RDW: 13 % (ref 11.5–15.5)
WBC: 4.7 10*3/uL (ref 4.0–10.5)

## 2015-01-12 LAB — TSH: TSH: 1.382 u[IU]/mL (ref 0.350–4.500)

## 2015-01-12 LAB — LIPID PANEL
CHOLESTEROL: 163 mg/dL (ref 125–200)
HDL: 55 mg/dL (ref >=40)
LDL Cholesterol: 92 mg/dL (ref <130)
Total CHOL/HDL Ratio: 3 Ratio (ref <=5.0)
Triglycerides: 80 mg/dL (ref <150)
VLDL: 16 mg/dL (ref <30)

## 2015-01-12 LAB — BASIC METABOLIC PANEL WITH GFR
BUN: 22 mg/dL (ref 7–25)
CALCIUM: 9.7 mg/dL (ref 8.6–10.3)
CO2: 27 mmol/L (ref 20–31)
CREATININE: 1.27 mg/dL (ref 0.60–1.35)
Chloride: 104 mmol/L (ref 98–110)
GFR, EST AFRICAN AMERICAN: 83 mL/min (ref >=60)
GFR, Est Non African American: 72 mL/min (ref >=60)
GLUCOSE: 100 mg/dL — AB (ref 65–99)
Potassium: 4.8 mmol/L (ref 3.5–5.3)
Sodium: 140 mmol/L (ref 135–146)

## 2015-01-12 LAB — HEPATIC FUNCTION PANEL
ALBUMIN: 4.9 g/dL (ref 3.6–5.1)
ALT: 19 U/L (ref 9–46)
AST: 20 U/L (ref 10–40)
Alkaline Phosphatase: 37 U/L — ABNORMAL LOW (ref 40–115)
Bilirubin, Direct: 0.1 mg/dL (ref <=0.2)
Indirect Bilirubin: 0.2 mg/dL (ref 0.2–1.2)
TOTAL PROTEIN: 7.4 g/dL (ref 6.1–8.1)
Total Bilirubin: 0.3 mg/dL (ref 0.2–1.2)

## 2015-01-12 LAB — TESTOSTERONE: TESTOSTERONE: 464 ng/dL (ref 300–890)

## 2015-01-12 LAB — HEMOGLOBIN A1C
Hgb A1c MFr Bld: 5.7 % — ABNORMAL HIGH (ref <5.7)
MEAN PLASMA GLUCOSE: 117 mg/dL — AB (ref <117)

## 2015-01-12 LAB — MAGNESIUM: MAGNESIUM: 2.1 mg/dL (ref 1.5–2.5)

## 2015-01-12 MED ORDER — MELOXICAM 7.5 MG PO TABS: 7.5000 mg | ORAL_TABLET | Freq: Every day | ORAL | Status: AC

## 2015-01-12 MED ORDER — FENOFIBRATE 145 MG PO TABS: 145.0000 mg | ORAL_TABLET | Freq: Every day | ORAL | Status: DC

## 2015-01-12 MED ORDER — MONTELUKAST SODIUM 10 MG PO TABS: ORAL_TABLET | ORAL | Status: DC

## 2015-01-12 MED ORDER — PRAVASTATIN SODIUM 20 MG PO TABS: 20.0000 mg | ORAL_TABLET | Freq: Every evening | ORAL | Status: DC

## 2015-01-12 NOTE — Patient Instructions (Signed)
We want weight loss that will last so you should lose 1-2 pounds a week.  THAT IS IT! Please pick THREE things a month to change. Once it is a habit check off the item. Then pick another three items off the list to become habits.  If you are already doing a habit on the list GREAT!  Cross that item off! o Don't drink your calories. Ie, alcohol, soda, fruit juice, and sweet tea.  o Drink more water. Drink a glass when you feel hungry or before each meal.  o Eat breakfast - Complex carb and protein (likeDannon light and fit yogurt, oatmeal, fruit, eggs, turkey bacon). o Measure your cereal.  Eat no more than one cup a day. (ie Kashi) o Eat an apple a day. o Add a vegetable a day. o Try a new vegetable a month. o Use Pam! Stop using oil or butter to cook. o Don't finish your plate or use smaller plates. o Share your dessert. o Eat sugar free Jello for dessert or frozen grapes. o Don't eat 2-3 hours before bed. o Switch to whole wheat bread, pasta, and brown rice. o Make healthier choices when you eat out. No fries! o Pick baked chicken, NOT fried. o Don't forget to SLOW DOWN when you eat. It is not going anywhere.  o Take the stairs. o Park far away in the parking lot o Lift soup cans (or weights) for 10 minutes while watching TV. o Walk at work for 10 minutes during break. o Walk outside 1 time a week with your friend, kids, dog, or significant other. o Start a walking group at church. o Walk the mall as much as you can tolerate.  o Keep a food diary. o Weigh yourself daily. o Walk for 15 minutes 3 days per week. o Cook at home more often and eat out less.  If life happens and you go back to old habits, it is okay.  Just start over. You can do it!   If you experience chest pain, get short of breath, or tired during the exercise, please stop immediately and inform your doctor.  Before you even begin to attack a weight-loss plan, it pays to remember this: You are not fat. You have fat.  Losing weight isn't about blame or shame; it's simply another achievement to accomplish. Dieting is like any other skill-you have to buckle down and work at it. As long as you act in a smart, reasonable way, you'll ultimately get where you want to be. Here are some weight loss pearls for you.  1. It's Not a Diet. It's a Lifestyle Thinking of a diet as something you're on and suffering through only for the short term doesn't work. To shed weight and keep it off, you need to make permanent changes to the way you eat. It's OK to indulge occasionally, of course, but if you cut calories temporarily and then revert to your old way of eating, you'll gain back the weight quicker than you can say yo-yo. Use it to lose it. Research shows that one of the best predictors of long-term weight loss is how many pounds you drop in the first month. For that reason, nutritionists often suggest being stricter for the first two weeks of your new eating strategy to build momentum. Cut out added sugar and alcohol and avoid unrefined carbs. After that, figure out how you can reincorporate them in a way that's healthy and maintainable.  2. There's a Right Way   Way to Exercise Working out burns calories and fat and boosts your metabolism by building muscle. But those trying to lose weight are notorious for overestimating the number of calories they burn and underestimating the amount they take in. Unfortunately, your system is biologically programmed to hold on to extra pounds and that means when you start exercising, your body senses the deficit and ramps up its hunger signals. If you're not diligent, you'll eat everything you burn and then some. Use it to lose it. Cardio gets all the exercise glory, but strength and interval training are the real heroes. They help you build lean muscle, which in turn increases your metabolism and calorie-burning ability 3. Don't Overreact to Mild Hunger Some people have a hard time losing weight because  of hunger anxiety. To them, being hungry is bad-something to be avoided at all costs-so they carry snacks with them and eat when they don't need to. Others eat because they're stressed out or bored. While you never want to get to the point of being ravenous (that's when bingeing is likely to happen), a hunger pang, a craving, or the fact that it's 3:00 p.m. should not send you racing for the vending machine or obsessing about the energy bar in your purse. Ideally, you should put off eating until your stomach is growling and it's difficult to concentrate.  Use it to lose it. When you feel the urge to eat, use the HALT method. Ask yourself, Am I really hungry? Or am I angry or anxious, lonely or bored, or tired? If you're still not certain, try the apple test. If you're truly hungry, an apple should seem delicious; if it doesn't, something else is going on. Or you can try drinking water and making yourself busy, if you are still hungry try a healthy snack.  4. Not All Calories Are Created Equal The mechanics of weight loss are pretty simple: Take in fewer calories than you use for energy. But the kind of food you eat makes all the difference. Processed food that's high in saturated fat and refined starch or sugar can cause inflammation that disrupts the hormone signals that tell your brain you're full. The result: You eat a lot more.  Use it to lose it. Clean up your diet. Swap in whole, unprocessed foods, including vegetables, lean protein, and healthy fats that will fill you up and give you the biggest nutritional bang for your calorie buck. In a few weeks, as your brain starts receiving regular hunger and fullness signals once again, you'll notice that you feel less hungry overall and naturally start cutting back on the amount you eat.  5. Protein, Produce, and Plant-Based Fats Are Your Weight-Loss Trinity Here's why eating the three Ps regularly will help you drop pounds. Protein fills you up. You need it  to build lean muscle, which keeps your metabolism humming so that you can torch more fat. People in a weight-loss program who ate double the recommended daily allowance for protein (about 110 grams for a 150-pound woman) lost 70 percent of their weight from fat, while people who ate the RDA lost only about 40 percent, one study found. Produce is packed with filling fiber. "It's very difficult to consume too many calories if you're eating a lot of vegetables. Example: Three cups of broccoli is a lot of food, yet only 93 calories. (Fruit is another story. It can be easy to overeat and can contain a lot of calories from sugar, so be sure to   your intake.) Plant-based fats like olive oil and those in avocados and nuts are healthy and extra satiating.  Use it to lose it. Aim to incorporate each of the three Ps into every meal and snack. People who eat protein throughout the day are able to keep weight off, according to a study in the American Journal of Clinical Nutrition. In addition to meat, poultry and seafood, good sources are beans, lentils, eggs, tofu, and yogurt. As for fat, keep portion sizes in check by measuring out salad dressing, oil, and nut butters (shoot for one to two tablespoons). Finally, eat veggies or a little fruit at every meal. People who did that consumed 308 fewer calories but didn't feel any hungrier than when they didn't eat more produce.  7. How You Eat Is As Important As What You Eat In order for your brain to register that you're full, you need to focus on what you're eating. Sit down whenever you eat, preferably at a table. Turn off the TV or computer, put down your phone, and look at your food. Smell it. Chew slowly, and don't put another bite on your fork until you swallow. When women ate lunch this attentively, they consumed 30 percent less when snacking later than those who listened to an audiobook at lunchtime, according to a study in the British Journal of Nutrition. 8.  Weighing Yourself Really Works The scale provides the best evidence about whether your efforts are paying off. Seeing the numbers tick up or down or stagnate is motivation to keep going-or to rethink your approach. A 2015 study at [employer] found that daily weigh-ins helped people lose more weight, keep it off, and maintain that loss, even after two years. Use it to lose it. Step on the scale at the same time every day for the best results. If your weight shoots up several pounds from one weigh-in to the next, don't freak out. Eating a lot of salt the night before or having your period is the likely culprit. The number should return to normal in a day or two. It's a steady climb that you need to do something about. 9. Too Much Stress and Too Little Sleep Are Your Enemies When you're tired and frazzled, your body cranks up the production of cortisol, the stress hormone that can cause carb cravings. Not getting enough sleep also boosts your levels of ghrelin, a hormone associated with hunger, while suppressing leptin, a hormone that signals fullness and satiety. People on a diet who slept only five and a half hours a night for two weeks lost 55 percent less fat and were hungrier than those who slept eight and a half hours, according to a study in the [employer] Journal. Use it to lose it. Prioritize sleep, aiming for seven hours or more a night, which research shows helps lower stress. And make sure you're getting quality zzz's. If a snoring spouse or a fidgety cat wakes you up frequently throughout the night, you may end up getting the equivalent of just four hours of sleep, according to a study from [employer]. Keep pets out of the bedroom, and use a white-noise app to drown out snoring. 10. You Will Hit a plateau-And You Can Bust Through It As you slim down, your body releases much less leptin, the fullness hormone.  If you're not strength training, start right now.  Building muscle can raise your metabolism to help you overcome a plateau. To keep your body challenged and   burning calories, incorporate new moves and more intense intervals into your workouts or add another sweat session to your weekly routine. Alternatively, cut an extra 100 calories or so a day from your diet. Now that you've lost weight, your body simply doesn't need as much fuel.   9 Ways to Naturally Increase Testosterone Levels  1.   Lose Weight If you're overweight, shedding the excess pounds may increase your testosterone levels, according to research presented at the Endocrine Society's 2012 meeting. Overweight men are more likely to have low testosterone levels to begin with, so this is an important trick to increase your body's testosterone production when you need it most.  2.   High-Intensity Exercise like Peak Fitness  Short intense exercise has a proven positive effect on increasing testosterone levels and preventing its decline. That's unlike aerobics or prolonged moderate exercise, which have shown to have negative or no effect on testosterone levels. Having a whey protein meal after exercise can further enhance the satiety/testosterone-boosting impact (hunger hormones cause the opposite effect on your testosterone and libido). Here's a summary of what a typical high-intensity Peak Fitness routine might look like: " Warm up for three minutes  " Exercise as hard and fast as you can for 30 seconds. You should feel like you couldn't possibly go on another few seconds  " Recover at a slow to moderate pace for 90 seconds  " Repeat the high intensity exercise and recovery 7 more times .  3.   Consume Plenty of Zinc The mineral zinc is important for testosterone production, and supplementing your diet for as little as six weeks has been shown to cause a marked improvement in testosterone among men with low levels.1 Likewise, research has shown that restricting dietary sources of zinc leads  to a significant decrease in testosterone, while zinc supplementation increases it2 -- and even protects men from exercised-induced reductions in testosterone levels.3 It's estimated that up to 45 percent of adults over the age of 60 may have lower than recommended zinc intakes; even when dietary supplements were added in, an estimated 20-25 percent of older adults still had inadequate zinc intakes, according to a [employer] and Nutrition Examination Survey.4 Your diet is the best source of zinc; along with protein-rich foods like meats and fish, other good dietary sources of zinc include raw milk, raw cheese, beans, and yogurt or kefir made from raw milk. It can be difficult to obtain enough dietary zinc if you're a vegetarian, and also for meat-eaters as well, largely because of conventional farming methods that rely heavily on chemical fertilizers and pesticides. These chemicals deplete the soil of nutrients ... nutrients like zinc that must be absorbed by plants in order to be passed on to you. In many cases, you may further deplete the nutrients in your food by the way you prepare it. For most food, cooking it will drastically reduce its levels of nutrients like zinc . particularly over-cooking, which many people do. If you decide to use a zinc supplement, stick to a dosage of less than 40 mg a day, as this is the recommended adult upper limit. Taking too much zinc can interfere with your body's ability to absorb other minerals, especially copper, and may cause nausea as a side effect.  4.   Strength Training In addition to Peak Fitness, strength training is also known to boost testosterone levels, provided you are doing so intensely enough. When strength training to boost testosterone, you'll want to increase the weight  and lower your number of reps, and then focus on exercises that work a large number of muscles, such as dead lifts or squats.  You can "turbo-charge" your weight training by going  slower. By slowing down your movement, you're actually turning it into a high-intensity exercise. Super Slow movement allows your muscle, at the microscopic level, to access the maximum number of cross-bridges between the protein filaments that produce movement in the muscle.   5.   Optimize Your Vitamin D Levels Vitamin D, a steroid hormone, is essential for the healthy development of the nucleus of the sperm cell, and helps maintain semen quality and sperm count. Vitamin D also increases levels of testosterone, which may boost libido. In one study, overweight men who were given vitamin D supplements had a significant increase in testosterone levels after one year.5   6.   Reduce Stress When you're under a lot of stress, your body releases high levels of the stress hormone cortisol. This hormone actually blocks the effects of testosterone,6 presumably because, from a biological standpoint, testosterone-associated behaviors (mating, competing, aggression) may have lowered your chances of survival in an emergency (hence, the "fight or flight" response is dominant, courtesy of cortisol).  7.   Limit or Eliminate Sugar from Your Diet Testosterone levels decrease after you eat sugar, which is likely because the sugar leads to a high insulin level, another factor leading to low testosterone.7 Based on USDA estimates, the average American consumes 12 teaspoons of sugar a day, which equates to about TWO TONS of sugar during a lifetime.  8.   Eat Healthy Fats By healthy, this means not only mon- and polyunsaturated fats, like that found in avocadoes and nuts, but also saturated, as these are essential for building testosterone. Research shows that a diet with less than 40 percent of energy as fat (and that mainly from animal sources, i.e. saturated) lead to a decrease in testosterone levels.8 My personal diet is about 60-70 percent healthy fat, and other experts agree that the ideal diet includes somewhere  between 50-70 percent fat.  It's important to understand that your body requires saturated fats from animal and vegetable sources (such as meat, dairy, certain oils, and tropical plants like coconut) for optimal functioning, and if you neglect this important food group in favor of sugar, grains and other starchy carbs, your health and weight are almost guaranteed to suffer. Examples of healthy fats you can eat more of to give your testosterone levels a boost include: Olives and Olive oil  Coconuts and coconut oil Butter made from raw grass-fed organic milk Raw nuts, such as, almonds or pecans Organic pastured egg yolks Avocados Grass-fed meats Palm oil Unheated organic nut oils   9.   Boost Your Intake of Branch Chain Amino Acids (BCAA) from Foods Like Whey Protein Research suggests that BCAAs result in higher testosterone levels, particularly when taken along with resistance training.9 While BCAAs are available in supplement form, you'll find the highest concentrations of BCAAs like leucine in dairy products - especially quality cheeses and whey protein. Even when getting leucine from your natural food supply, it's often wasted or used as a building block instead of an anabolic agent. So to create the correct anabolic environment, you need to boost leucine consumption way beyond mere maintenance levels. That said, keep in mind that using leucine as a free form amino acid can be highly counterproductive as when free form amino acids are artificially administrated, they rapidly enter your circulation while disrupting  insulin function, and impairing your body's glycemic control. Food-based leucine is really the ideal form that can benefit your muscles without side effects.

## 2015-01-12 NOTE — Progress Notes (Signed)
Assessment and Plan:  Hypertension: Continue medication, monitor blood pressure at home. Continue DASH diet.  Reminder to go to the ER if any CP, SOB, nausea, dizziness, severe HA, changes vision/speech, left arm numbness and tingling, and jaw pain. Cholesterol: Continue diet and exercise. Check cholesterol.  Pre-diabetes-Continue diet and exercise. Check A1C Vitamin D Def- check level and continue medications.  Obesity with co morbidities- long discussion about weight loss, diet, and exercise    Continue diet and meds as discussed. Further disposition pending results of labs.  HPI 40 y.o. male  presents for 3 month follow up with hypertension, hyperlipidemia, prediabetes and vitamin D. His blood pressure has been controlled at home, today their BP is BP: 110/60 mmHg He does not workout. He denies chest pain, shortness of breath, dizziness.  He is on cholesterol medication, pravastatin 20 M,W,F and 10mg  other days and denies myalgias. His cholesterol is at goal, less than 100. The cholesterol last visit was:   Lab Results  Component Value Date   CHOL 168 10/02/2014   HDL 48 10/02/2014   LDLCALC 101 10/02/2014   TRIG 94 10/02/2014   CHOLHDL 3.5 10/02/2014  He has been working on diet and exercise for prediabetes, and denies paresthesia of the feet, polydipsia, polyuria and visual disturbances. Last A1C in the office was:  Lab Results  Component Value Date   HGBA1C 5.7* 10/02/2014  Patient is on Vitamin D supplement.   Lab Results  Component Value Date   VD25OH 40 10/02/2014  He has GERD and in on zantac for this, he has allergies and possible asthma versus GERD, on Singulair/claritin and albuterol rarely.  BMI is Body mass index is 32.96 kg/(m^2)., he is working on diet and exercise. He has OSA but not on CPAP due to mask intolerance.  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 He states that the testosterone helps with his energy, libido, muscle mass. Lab Results  Component Value Date   TESTOSTERONE 254* 10/02/2014      Current Medications:  Current Outpatient Prescriptions on File Prior to Visit  Medication Sig Dispense Refill  . B Complex-C (SUPER B COMPLEX PO) Take by mouth daily.    . Cholecalciferol (VITAMIN D-3) 5000 UNITS TABS Take 5,000 Units by mouth daily.    . fenofibrate (TRICOR) 145 MG tablet Take 1 tablet (145 mg total) by mouth daily. 90 tablet 3  . loratadine (CLARITIN) 10 MG tablet Take 10 mg by mouth every morning.    . Magnesium 500 MG TABS Take 500 mg by mouth daily.    . meloxicam (MOBIC) 7.5 MG tablet Take 1 tablet (7.5 mg total) by mouth daily. 30 tablet 2  . montelukast (SINGULAIR) 10 MG tablet TAKE 1 TABLET DAILY FOR ALLERGY 90 tablet 1  . Omega-3 Fatty Acids (FISH OIL PO) Take by mouth. Two daily    . OVER THE COUNTER MEDICATION Take 2 capsules by mouth daily. zyflamend - spice mixture as an antiinflammatory    . OVER THE COUNTER MEDICATION Take 1-2 capsules by mouth daily as needed (for anxiety). True calm    . OVER THE COUNTER MEDICATION 2 tablets daily. Microlactin    . OVER THE COUNTER MEDICATION 2 tablets daily as needed. Quercetin with Bromelein    . OVER THE COUNTER MEDICATION at bedtime.  Cherry tart    . pravastatin (PRAVACHOL) 20 MG tablet TAKE 1 TABLET EVERY EVENING 90 tablet 3  . ranitidine (ZANTAC) 150 MG tablet Take 150 mg by mouth 2 (two) times daily.    . Zinc 50 MG TABS Take 50 mg by mouth daily.     No current facility-administered medications on file prior to visit.   Medical History:  Past Medical History  Diagnosis Date  . Hyperlipidemia   . Other abnormal glucose   . GERD (gastroesophageal reflux disease)   . Allergic rhinitis, cause unspecified    Allergies: No Known Allergies   Review of Systems:  Review of Systems   Constitutional: Positive for malaise/fatigue. Negative for fever, chills, weight loss and diaphoresis.  HENT: Negative.   Eyes: Negative.   Respiratory: Negative.   Cardiovascular: Negative.   Gastrointestinal: Negative.   Genitourinary: Negative.   Musculoskeletal: Negative.   Skin: Negative.   Neurological: Negative.  Negative for weakness.       + snoring  Endo/Heme/Allergies: Negative.   Psychiatric/Behavioral: Negative.     Family history- Review and unchanged Social history- Review and unchanged Physical Exam: BP 110/60 mmHg  Pulse 74  Temp(Src) 97.7 F (36.5 C) (Temporal)  Resp 16  Ht 5' 8.5" (1.74 m)  Wt 220 lb (99.791 kg)  BMI 32.96 kg/m2  SpO2 93% Wt Readings from Last 3 Encounters:  01/12/15 220 lb (99.791 kg)  10/02/14 228 lb 3.2 oz (103.511 kg)  05/19/14 232 lb (105.235 kg)   General Appearance: Well nourished, in no apparent distress. Eyes: PERRLA, EOMs, conjunctiva no swelling or erythema Sinuses: No Frontal/maxillary tenderness ENT/Mouth: Ext aud canals clear, TMs without erythema, bulging. No erythema, swelling, or exudate on post pharynx.  Tonsils not swollen or erythematous. Hearing normal.  Neck: Supple, thyroid normal.  Crowded mouth Respiratory: Respiratory effort normal, BS equal bilaterally without rales, rhonchi, wheezing or stridor.  Cardio: RRR with no MRGs. Brisk peripheral pulses without edema.  Abdomen: Soft, + BS, obese  Non tender, no guarding, rebound, hernias, masses. Lymphatics: Non tender without lymphadenopathy.  Musculoskeletal: Full ROM, 5/5 strength, normal gait.  Skin: Warm, dry without rashes, lesions, ecchymosis.  Neuro: Cranial nerves intact. Normal muscle tone, no cerebellar symptoms. Sensation intact.  Psych: Awake and oriented X 3, normal affect, Insight and Judgment appropriate.    Quentin MullingAmanda Briante Loveall, PA-C 8:40 AM [facility]

## 2015-01-13 LAB — VITAMIN D 25 HYDROXY (VIT D DEFICIENCY, FRACTURES): Vit D, 25-Hydroxy: 56 ng/mL (ref 30–100)

## 2015-03-12 ENCOUNTER — Encounter: Payer: Self-pay | Admitting: Internal Medicine

## 2015-04-02 ENCOUNTER — Other Ambulatory Visit: Payer: Self-pay | Admitting: Internal Medicine

## 2015-07-07 ENCOUNTER — Encounter: Payer: Self-pay | Admitting: Internal Medicine

## 2015-07-08 ENCOUNTER — Other Ambulatory Visit: Payer: Self-pay | Admitting: Internal Medicine

## 2015-07-08 MED ORDER — CEPHALEXIN 500 MG PO CAPS: 500.0000 mg | ORAL_CAPSULE | Freq: Four times a day (QID) | ORAL | Status: AC

## 2015-07-12 ENCOUNTER — Other Ambulatory Visit: Payer: Self-pay | Admitting: Internal Medicine

## 2015-07-12 DIAGNOSIS — E782 Mixed hyperlipidemia: Secondary | ICD-10-CM

## 2015-07-16 ENCOUNTER — Ambulatory Visit (INDEPENDENT_AMBULATORY_CARE_PROVIDER_SITE_OTHER): Payer: BLUE CROSS/BLUE SHIELD | Admitting: Internal Medicine

## 2015-07-16 ENCOUNTER — Encounter: Payer: Self-pay | Admitting: Internal Medicine

## 2015-07-16 VITALS — BP 108/60 | HR 62 | Temp 97.7°F | Resp 16 | Ht 68.5 in | Wt 208.0 lb

## 2015-07-16 DIAGNOSIS — N451 Epididymitis: Secondary | ICD-10-CM

## 2015-07-16 MED ORDER — DOXYCYCLINE HYCLATE 100 MG PO CAPS: ORAL_CAPSULE | ORAL | Status: AC

## 2015-07-16 NOTE — Progress Notes (Signed)
  Subjective:    Patient ID: Adrian Francis, male    DOB: Jul 31, 1975, 40 y.o.   MRN: [id number]  HPI  This nice 40 yo MWM with labile HTN, HLD, GERD, PreDM, Low T was treated empirically since 6/28 with Keflex for Rt testicular pain - suspected orchitisand initially improved , but has har relapse of pains in the Rt testicle for the last 1-2 days. Denis hx/o trauma, fever, chills, sweats or UT sx's.   Medication Sig  . B Complex-C (SUPER B COMPLEX PO) Take by mouth daily.  . cephALEXin (KEFLEX) 500 MG capsule Take 1 capsule (500 mg total) by mouth 4 (four) times daily.  . Cholecalciferol (VITAMIN D-3) 5000 UNITS TABS Take 5,000 Units by mouth daily.  . fenofibrate (TRICOR) 145 MG tablet TAKE 1 TABLET DAILY  . loratadine (CLARITIN) 10 MG tablet Take 10 mg by mouth every morning.  . Magnesium 500 MG TABS Take 500 mg by mouth daily.  . meloxicam (MOBIC) 7.5 MG tablet Take 1 tablet (7.5 mg total) by mouth daily.  . montelukast (SINGULAIR) 10 MG tablet TAKE 1 TABLET DAILY FOR ALLERGY  . Omega-3 Fatty Acids (FISH OIL PO) Take by mouth. Two daily  . OVER THE COUNTER MEDICATION Take 2 capsules by mouth daily. zyflamend - spice mixture as an antiinflammatory  . OVER THE COUNTER MEDICATION Take 1-2 capsules by mouth daily as needed (for anxiety). True calm  . OVER THE COUNTER MEDICATION 2 tablets daily. Microlactin  . OVER THE COUNTER MEDICATION 2 tablets daily as needed. Quercetin with Bromelein  . OVER THE COUNTER MEDICATION at bedtime. Cherry tart  . pravastatin (PRAVACHOL) 20 MG tablet TAKE 1 TABLET EVERY EVENING  . ranitidine (ZANTAC) 150 MG tablet Take 150 mg by mouth 2 (two) times daily.  . Zinc 50 MG TABS Take 50 mg by mouth daily.   No Known Allergies   Past Medical History  Diagnosis Date  . Hyperlipidemia   . Other abnormal glucose   . GERD (gastroesophageal reflux disease)   . Allergic rhinitis, cause unspecified    Past Surgical History  Procedure Laterality Date  . Tonsillectomy     . Tonsillectomy and adenoidectomy     Review of Systems  10 point systems review negative except as above.    Objective:   Physical Exam  - GU exam finds no inguinal hernias and cords are normal. Lt testes is Nl few a few mid epididymal cysts. Rt testes is Nl with moderately tender mid epididymal cysts.    Assessment & Plan:   1. Epididymitis, right  - doxycycline (VIBRAMYCIN) 100 MG capsule; Take 1 capsule daily with food for infection  Dispense: 30 capsule; Refill: 1  - Patient & wife instructed in his illness & treatments.

## 2015-07-16 NOTE — Patient Instructions (Addendum)
Epididymitis Epididymitis is swelling (inflammation) of the epididymis. The epididymis is a cord-like structure that is located along the top and back part of the testicle. It collects and stores sperm from the testicle. This condition can also cause pain and swelling of the testicle and scrotum. Symptoms usually start suddenly (acute epididymitis). Sometimes epididymitis starts gradually and lasts for a while (chronic epididymitis). This type may be harder to treat. CAUSES In men 59 and younger, this condition is usually caused by a bacterial infection or sexually transmitted disease (STD), such as:  Gonorrhea.  Chlamydia.  In men 31 and older who do not have anal sex, this condition is usually caused by bacteria from a blockage or abnormalities in the urinary system. These can result from:  Having a tube placed into the bladder (urinary catheter).  Having an enlarged or inflamed prostate gland.  Having recent urinary tract surgery. In men who have a condition that weakens the body's defense system (immune system), such as HIV, this condition can be caused by:   Other bacteria, including tuberculosis and syphilis.  Viruses.  Fungi. Sometimes this condition occurs without infection. That may happen if urine flows backward into the epididymis after heavy lifting or straining. RISK FACTORS This condition is more likely to develop in men:  Who have unprotected sex with more than one partner.  Who have anal sex.   Who have recently had surgery.   Who have a urinary catheter.  Who have urinary problems.  Who have a suppressed immune system. SYMPTOMS  This condition usually begins suddenly with chills, fever, and pain behind the scrotum and in the testicle. Other symptoms include:   Swelling of the scrotum, testicle, or both.  Pain whenejaculatingor urinating.  Pain in the back or belly.  Nausea.  Itching and discharge from the penis.  Frequent need to pass  urine.  Redness and tenderness of the scrotum. DIAGNOSIS Your health care provider can diagnose this condition based on your symptoms and medical history. Your health care provider will also do a physical exam to ask about your symptoms and check your scrotum and testicle for swelling, pain, and redness. You may also have other tests, including:   Examination of discharge from the penis.  Urine tests for infections, such as STDs.  Your health care provider may test you for other STDs, including HIV. TREATMENT Treatment for this condition depends on the cause. If your condition is caused by a bacterial infection, oral antibiotic medicine may be prescribed. If the bacterial infection has spread to your blood, you may need to receive IV antibiotics. Nonbacterial epididymitis is treated with home care that includes bed rest and elevation of the scrotum. Surgery may be needed to treat:  Bacterial epididymitis that causes pus to build up in the scrotum (abscess).  Chronic epididymitis that has not responded to other treatments. HOME CARE INSTRUCTIONS Medicines  Take over-the-counter and prescription medicines only as told by your health care provider.   If you were prescribed an antibiotic medicine, take it as told by your health care provider. Do not stop taking the antibiotic even if your condition improves. Sexual Activity  If your epididymitis was caused by an STD, avoid sexual activity until your treatment is complete.  Inform your sexual partner or partners if you test positive for an STD. They may need to be treated.Do not engage in sexual activity with your partner or partners until their treatment is completed. General Instructions  Return to your normal activities as told  by your health care provider. Ask your health care provider what activities are safe for you.  Keep your scrotum elevated and supported while resting. Ask your health care provider if you should wear a  scrotal support, such as a jockstrap. Wear it as told by your health care provider.  If directed, apply ice to the affected area:   Put ice in a plastic bag.  Place a towel between your skin and the bag.  Leave the ice on for 20 minutes, 2-3 times per day.  Try taking a sitz bath to help with discomfort. This is a warm water bath that is taken while you are sitting down. The water should only come up to your hips and should cover your buttocks. Do this 3-4 times per day or as told by your health care provider.  Keep all follow-up visits as told by your health care provider. This is important. SEEK MEDICAL CARE IF:   You have a fever.   Your pain medicine is not helping.   Your pain is getting worse.   Your symptoms do not improve within three days.

## 2015-09-03 ENCOUNTER — Encounter: Payer: Self-pay | Admitting: Internal Medicine

## 2015-10-02 ENCOUNTER — Ambulatory Visit (INDEPENDENT_AMBULATORY_CARE_PROVIDER_SITE_OTHER): Payer: BLUE CROSS/BLUE SHIELD | Admitting: Physician Assistant

## 2015-10-02 ENCOUNTER — Encounter: Payer: Self-pay | Admitting: Physician Assistant

## 2015-10-02 VITALS — BP 120/60 | HR 80 | Temp 97.5°F | Resp 16 | Ht 68.5 in | Wt 208.2 lb

## 2015-10-02 DIAGNOSIS — J309 Allergic rhinitis, unspecified: Secondary | ICD-10-CM

## 2015-10-02 DIAGNOSIS — E782 Mixed hyperlipidemia: Secondary | ICD-10-CM | POA: Diagnosis not present

## 2015-10-02 DIAGNOSIS — R7303 Prediabetes: Secondary | ICD-10-CM

## 2015-10-02 DIAGNOSIS — R6889 Other general symptoms and signs: Secondary | ICD-10-CM

## 2015-10-02 DIAGNOSIS — Z0001 Encounter for general adult medical examination with abnormal findings: Secondary | ICD-10-CM | POA: Diagnosis not present

## 2015-10-02 DIAGNOSIS — E291 Testicular hypofunction: Secondary | ICD-10-CM

## 2015-10-02 DIAGNOSIS — Z79899 Other long term (current) drug therapy: Secondary | ICD-10-CM | POA: Diagnosis not present

## 2015-10-02 DIAGNOSIS — K219 Gastro-esophageal reflux disease without esophagitis: Secondary | ICD-10-CM

## 2015-10-02 DIAGNOSIS — E669 Obesity, unspecified: Secondary | ICD-10-CM

## 2015-10-02 DIAGNOSIS — E559 Vitamin D deficiency, unspecified: Secondary | ICD-10-CM | POA: Diagnosis not present

## 2015-10-02 DIAGNOSIS — R001 Bradycardia, unspecified: Secondary | ICD-10-CM

## 2015-10-02 DIAGNOSIS — I10 Essential (primary) hypertension: Secondary | ICD-10-CM

## 2015-10-02 DIAGNOSIS — M25562 Pain in left knee: Secondary | ICD-10-CM

## 2015-10-02 DIAGNOSIS — Z136 Encounter for screening for cardiovascular disorders: Secondary | ICD-10-CM

## 2015-10-02 DIAGNOSIS — Z1389 Encounter for screening for other disorder: Secondary | ICD-10-CM

## 2015-10-02 LAB — CBC WITH DIFFERENTIAL/PLATELET
BASOS PCT: 0 %
Basophils Absolute: 0 cells/uL (ref 0–200)
Eosinophils Absolute: 200 cells/uL (ref 15–500)
Eosinophils Relative: 4 %
HEMATOCRIT: 40.1 % (ref 38.5–50.0)
Hemoglobin: 13.4 g/dL (ref 13.2–17.1)
LYMPHS ABS: 2200 {cells}/uL (ref 850–3900)
LYMPHS PCT: 44 %
MCH: 29.1 pg (ref 27.0–33.0)
MCHC: 33.4 g/dL (ref 32.0–36.0)
MCV: 87 fL (ref 80.0–100.0)
MONO ABS: 450 {cells}/uL (ref 200–950)
MPV: 9.5 fL (ref 7.5–12.5)
Monocytes Relative: 9 %
Neutro Abs: 2150 cells/uL (ref 1500–7800)
Neutrophils Relative %: 43 %
PLATELETS: 224 10*3/uL (ref 140–400)
RBC: 4.61 MIL/uL (ref 4.20–5.80)
RDW: 13.7 % (ref 11.0–15.0)
WBC: 5 10*3/uL (ref 3.8–10.8)

## 2015-10-02 LAB — HEPATIC FUNCTION PANEL
ALBUMIN: 4.7 g/dL (ref 3.6–5.1)
ALT: 15 U/L (ref 9–46)
AST: 20 U/L (ref 10–40)
Alkaline Phosphatase: 32 U/L — ABNORMAL LOW (ref 40–115)
BILIRUBIN DIRECT: 0.1 mg/dL (ref <=0.2)
Indirect Bilirubin: 0.3 mg/dL (ref 0.2–1.2)
Total Bilirubin: 0.4 mg/dL (ref 0.2–1.2)
Total Protein: 7 g/dL (ref 6.1–8.1)

## 2015-10-02 LAB — BASIC METABOLIC PANEL WITH GFR
BUN: 19 mg/dL (ref 7–25)
CO2: 23 mmol/L (ref 20–31)
Calcium: 9.6 mg/dL (ref 8.6–10.3)
Chloride: 103 mmol/L (ref 98–110)
Creat: 1.19 mg/dL (ref 0.60–1.35)
GFR, EST NON AFRICAN AMERICAN: 78 mL/min (ref >=60)
GLUCOSE: 91 mg/dL (ref 65–99)
POTASSIUM: 4.3 mmol/L (ref 3.5–5.3)
Sodium: 139 mmol/L (ref 135–146)

## 2015-10-02 LAB — LIPID PANEL
CHOL/HDL RATIO: 3.1 ratio (ref <=5.0)
CHOLESTEROL: 179 mg/dL (ref 125–200)
HDL: 58 mg/dL (ref >=40)
LDL Cholesterol: 107 mg/dL (ref <130)
Triglycerides: 70 mg/dL (ref <150)
VLDL: 14 mg/dL (ref <30)

## 2015-10-02 LAB — MAGNESIUM: MAGNESIUM: 2 mg/dL (ref 1.5–2.5)

## 2015-10-02 LAB — TSH: TSH: 1.37 m[IU]/L (ref 0.40–4.50)

## 2015-10-02 NOTE — Patient Instructions (Signed)
Medial Collateral Knee Ligament Sprain With Phase I Rehab The medial collateral ligament (MCL) of the knee helps hold the knee joint in proper alignment and prevents the bones from shifting out of alignment (displacing) to the inside (medially). Injury to the knee may cause a tear in the MCL ligament (sprain). Sprains may heal without treatment, but this often results in a loose joint. Sprains are classified into three categories. Grade 1 sprains cause pain, but the tendon is not lengthened. Grade 2 sprains include a lengthened ligament, due to the ligament being stretched or partially ruptured. With grade 2 sprains, there is still function, although possibly decreased. Grade 3 sprains involve a complete tear of the tendon or muscle, and function is usually impaired. SYMPTOMS   Pain and tenderness on the inner side of the knee.  A "pop," tearing or pulling sensation at the time of injury.  Bruising (contusion) at the site of injury, within 48 hours of injury.  Knee stiffness.  Limping, often walking with the knee bent. CAUSES  An MCL sprain occurs when a force is placed on the ligament that is greater than it can handle. Common mechanisms of injury include:  Direct hit (trauma) to the outer side of the knee, especially if the foot is planted on the ground.  Forceful pivoting of the body and leg, while the foot is planted on the ground. RISK INCREASES WITH:  Contact sports (football, rugby).  Sports that require pivoting or cutting (soccer).  Poor knee strength and flexibility.  Improper equipment use. PREVENTION  Warm up and stretch properly before activity.  Maintain physical fitness:  Strength, flexibility and endurance.  Cardiovascular fitness.  Wear properly fitted protective equipment (correct length of cleats for surface).  Functional braces may be effective in preventing injury. PROGNOSIS  MCL tears usually heal without the need for surgery. Sometimes however,  surgery is required. RELATED COMPLICATIONS  Frequently recurring symptoms, such as the knee giving way, knee instability or knee swelling.  Injury to other structures in the knee joint:  Meniscal cartilage, resulting in locking and swelling of the knee.  Articular cartilage, resulting in knee arthritis.  Other ligaments of the knee.  Injury to nerves, resulting in numbness of the outer leg, foot or ankle and weakness or paralysis, with inability to raise the ankle or toes.  Knee stiffness. TREATMENT Treatment first involves the use of ice and medicine, to reduce pain and inflammation. The use of strengthening and stretching exercises may help reduce pain with activity. These exercises may be performed at home, but referral to a therapist is often advised. You may be advised to walk with crutches until you are able to walk without a limp. Your caregiver may provide you with a hinged knee brace to help regain a full range of motion, while also protecting the injured knee. For severe MCL injuries or injuries that involve other ligaments of the knee, surgery is often advised. MEDICATION  Do not take pain medicine for 7 days before surgery.  Only use over-the-counter pain medicine as directed by your caregiver.  Only use prescription pain relievers as directed and only in needed amounts. HEAT AND COLD  Cold treatment (icing) should be applied for 10 to 15 minutes every 2 to 3 hours for inflammation and pain, and immediately after any activity, that aggravates the symptoms. Use ice packs or an ice massage.  Heat treatment may be used before performing stretching and strengthening activities prescribed by your caregiver, physical therapist or athletic trainer. Use   a heat pack or warm water soak. SEEK MEDICAL CARE IF:   Symptoms get worse or do not improve in 4 to 6 weeks, despite treatment.  New, unexplained symptoms develop. EXERCISES  PHASE I EXERCISES  RANGE OF MOTION (ROM) AND  STRETCHING EXERCISES-Medial Collateral Knee Ligament Sprain Phase I These are some of the initial exercises that your physician, physical therapist or athletic trainer may have you perform to begin your rehabilitation. When you demonstrate gains in your flexibility and strength, your caregiver may progress you to Phase II exercises. As you perform these exercises, remember:  These initial exercises are intended to be gentle. They will help you restore motion without increasing any swelling.  Completing these exercises allows less painful movement and prepares you for the more aggressive strengthening exercises in Phase II.  An effective stretch should be held for at least 30 seconds.  A stretch should never be painful. You should only feel a gentle lengthening or release in the stretched tissue. RANGE OF MOTION-Knee Flexion, Active  Lie on your back with both knees straight. (If this causes back discomfort, bend your healthy knee, placing your foot flat on the floor.)  Slowly slide your heel back toward your buttocks until you feel a gentle stretch in the front of your knee or thigh.  Hold for __________ seconds. Slowly slide your heel back to the starting position. Repeat __________ times. Complete this exercise __________ times per day. STRETCH-Knee Flexion, Supine  Lie on the floor with your right / left heel and foot lightly touching the wall. (Place both feet on the wall if you do not use a door frame.)  Without using any effort, allow gravity to slide your foot down the wall slowly until you feel a gentle stretch in the front of your right / left knee.  Hold this stretch for __________ seconds. Then return the leg to the starting position, using your health leg for help, if needed. Repeat __________ times. Complete this stretch __________ times per day. RANGE OF MOTION-Knee Flexion and Extension, Active-Assisted  Sit on the edge of a table or chair with your thighs firmly supported.  It may be helpful to place a folded towel under the end of your right / left thigh.  Flexion (bending): Place the ankle of your healthy leg on top of the other ankle. Use your healthy leg to gently bend your right / left knee until you feel a mild tension across the top of your knee.  Hold for __________ seconds.  Extension (straightening): Switch your ankles so your right / left leg is on top. Use your healthy leg to straighten your right / left knee until you feel a mild tension on the backside of your knee.  Hold for __________ seconds. Repeat __________ times. Complete this exercise __________ times per day. STRETCH-Knee Extension Sitting  Sit with your right / left leg/heel propped on another chair, coffee table, or foot stool.  Allow your leg muscles to relax, letting gravity straighten out your knee.*  You should feel a stretch behind your right / left knee. Hold this position for __________ seconds. Repeat __________ times. Complete this stretch __________ times per day. *Your physician, physical therapist or athletic trainer may instruct you to place a __________ weight on your thigh, just above your kneecap, to deepen the stretch. STRENGTHENING EXERCISES-Medial Collateral Knee Ligament Sprain Phase I These exercises may help you when beginning to rehabilitate your injury. They may resolve your symptoms with or without further involvement from   your physician, physical therapist or athletic trainer. While completing these exercises, remember:   In order to return to more demanding activities, you will likely need to progress to more challenging exercises. Your physician, physical therapist or athletic trainer will advance your exercises when your tissues show adequate healing and your muscles demonstrate increased strength.  Muscles can gain both the endurance and the strength needed for everyday activities through controlled exercises.  Complete these exercises as instructed by  your physician, physical therapist or athletic trainer. Increase the resistance and repetitions only as guided by your caregiver. STRENGTH-Quadriceps, Isometrics  Lie on your back with your right / left leg extended and your opposite knee bent.  Gradually tense the muscles in the front of your right / left thigh. You should see either your kneecap slide up toward your hip or an increased dimpling just above the knee. This motion will push the back of the knee down toward the floor, mat or bed on which you are lying.  Hold the muscle as tight as you can without increasing your pain for __________ seconds.  Relax the muscles slowly and completely in between each repetition. Repeat __________ times. Complete this exercise __________ times per day. STRENGTH-Quadriceps, Short Arcs  Lie on your back. Place a __________ inch towel roll under your knee so that the knee slightly bends.  Raise only your lower leg by tightening the muscles in the front of your thigh. Do not allow your thigh to rise.  Hold this position for __________ seconds. Repeat __________ times. Complete this exercise __________ times per day. OPTIONAL ANKLE WEIGHTS: Begin with ____________________, but DO NOT exceed ____________________. Increase in 1 pound/0.5 kilogram increments.  STRENGTH--Quadriceps, Straight Leg Raises Quality counts! Watch for signs that the quadriceps muscle is working, to be sure you are strengthening the correct muscles and not "cheating" by substituting with healthier muscles.  Lay on your back with your right / left leg extended and your opposite knee bent.  Tense the muscles in the front of your right / left thigh. You should see either your knee cap slide up or increased dimpling just above the knee. Your thigh may even shake a bit.  Tighten these muscles even more and raise your leg 4 to 6 inches off the floor. Hold for __________ seconds.  Keeping these muscles tense, lower your leg.  Relax  the muscles slowly and completely in between each repetition. Repeat __________ times. Complete this exercise __________ times per day. STRENGTH-Hamstring, Isometrics  Lie on your back on a firm surface.  Bend your right / left knee approximately __________ degrees.  Dig your heel into the surface as if you are trying to pull it toward your buttocks. Tighten the muscles in the back of your thighs to "dig" as hard as you can, without increasing any pain.  Hold this position for __________ seconds.  Release the tension gradually and allow your muscle to completely relax for __________ seconds in between each exercise. Repeat __________ times. Complete this exercise __________ times per day. STRENGTH-Hamstring, Curls  Lay on your stomach with your legs extended. (If you lay on a bed, your feet may hang over the edge.)  Tighten the muscles in the back of your thigh to bend your right / left knee up to 90 degrees. Keep your hips flat on the bed.  Hold this position for __________ seconds.  Slowly lower your leg back to the starting position. Repeat __________ times. Complete this exercise __________ times per day. OPTIONAL   ANKLE WEIGHTS: Begin with ____________________, but DO NOT exceed ____________________. Increase in 1 pound/0.5 kilogram increments.    This information is not intended to replace advice given to you by your health care provider. Make sure you discuss any questions you have with your health care provider.   Document Released: 12/23/2004 Document Revised: 01/13/2014 Document Reviewed: 04/06/2008 [employer] Patient Education 2016 [employer]  

## 2015-10-02 NOTE — Progress Notes (Signed)
Complete Physical  Assessment and Plan: Prediabetes Discussed general issues about diabetes pathophysiology and management., Educational material distributed., Suggested low cholesterol diet., Encouraged aerobic exercise., Discussed foot care., Reminded to get yearly retinal exam. - CBC with Differential/Platelet - BASIC METABOLIC PANEL WITH GFR - Hepatic function panel - Hemoglobin A1c - Insulin, fasting - EKG 12-Lead   Mixed hyperlipidemia -continue medications, check lipids, decrease fatty foods, increase activity.  - Lipid panel - EKG 12-Lead  Obesity Obesity with co morbidities- long discussion about weight loss, diet, and exercise - TSH  Vitamin D deficiency - Vit D  25 hydroxy (rtn osteoporosis monitoring)   Medication management - Magnesium  Gastroesophageal reflux disease without esophagitis Continue PPI/H2 blocker, diet discussed   Allergic rhinitis, unspecified allergic rhinitis type Continue OTC allergy pills  Hypogonadism male Improved with diet, check labs.  - Testosterone   Encounter for general adult medical examination with abnormal findings - CBC with Differential/Platelet - BASIC METABOLIC PANEL WITH GFR - Hepatic function panel - TSH - Lipid panel - Hemoglobin A1c - Insulin, fasting - Magnesium - Vit D  25 hydroxy (rtn osteoporosis monitoring) - Urinalysis, Routine w reflex microscopic (not at [facility]) - Microalbumin / creatinine urine ratio - EKG 12-Lead - Testosterone  Asymptomatic Sinus bradycardia No CP, SOB, dizziness, history of syncope with tachycardia 2 years ago, normal stress test Dr. Anne FuSkains, does have family history of SSS/pacemakers, will monitor closely, call if any symptoms. Improved with weight loss   Screening for blood or protein in urine - Urinalysis, Routine w reflex microscopic (not [facility]) - Microalbumin / creatinine urine ratio  Left knee pain Likely medial collateral ligament sprain/laxity- will give  exercises  Discussed med's effects and SE's. Screening labs and tests as requested with regular follow-up as recommended.  Over 40 minutes of exam, counseling, chart review and critical decision making was performed  HPI Patient presents for a complete physical.   His blood pressure has been controlled at home, today their BP is BP: 120/60 He does not workout, trying to walk a few times a week, goal 10,000 steps a day. He denies chest pain, shortness of breath, dizziness.  His EKG shows sinus bradycardia at 48 which is new from previous EKG's, he is not on AV nodal blocking medications, he has OSA, not on CPAP due to intolerance. He did have a syncopal episode 2 years ago with exertion, had tachycardia, saw Dr. Anne FuSkains, had normal stress test 2 years ago.  He denies CP, SOB, dizziness, fatigue.  He is on cholesterol medication, pravastatin 20mg  3 days a week and the rest is 10 mg, on tricor and denies myalgias. His cholesterol is at goal. The cholesterol last visit was:   Lab Results  Component Value Date   CHOL 163 01/12/2015   HDL 55 01/12/2015   LDLCALC 92 01/12/2015   TRIG 80 01/12/2015   CHOLHDL 3.0 01/12/2015   He has been working on diet and exercise for prediabetes,  and denies paresthesia of the feet, polydipsia, polyuria and visual disturbances. Last A1C in the office was:  Lab Results  Component Value Date   HGBA1C 5.7 (H) 01/12/2015   Patient is on Vitamin D supplement.   Lab Results  Component Value Date   VD25OH 56 01/12/2015     Last PSA was: Lab Results  Component Value Date   PSA 1.15 09/21/2013   He has GERD.  He has some pain left medial knee x high school, intermittent, worse last few months. Nothing  worse, has not taken any medications for it. No popping, clicking or locking, does not give way.  BMI is Body mass index is 31.2 kg/m., he is working on diet and exercise. Has + OSA but can not tolerate a CPAP, has been losing weight and wife states that snoring  has improved. He also has testosterone def but declines treatment.  Wt Readings from Last 3 Encounters:  10/02/15 208 lb 3.2 oz (94.4 kg)  07/16/15 208 lb (94.3 kg)  01/12/15 220 lb (99.8 kg)    Current Medications:  Current Outpatient Prescriptions on File Prior to Visit  Medication Sig Dispense Refill  . B Complex-C (SUPER B COMPLEX PO) Take by mouth daily.    . Cholecalciferol (VITAMIN D-3) 5000 UNITS TABS Take 5,000 Units by mouth daily.    . fenofibrate (TRICOR) 145 MG tablet TAKE 1 TABLET DAILY 90 tablet 0  . loratadine (CLARITIN) 10 MG tablet Take 10 mg by mouth every morning.    . Magnesium 500 MG TABS Take 500 mg by mouth daily.    . meloxicam (MOBIC) 7.5 MG tablet Take 1 tablet (7.5 mg total) by mouth daily. 30 tablet 2  . montelukast (SINGULAIR) 10 MG tablet TAKE 1 TABLET DAILY FOR ALLERGY 90 tablet 0  . Omega-3 Fatty Acids (FISH OIL PO) Take by mouth. Two daily    . OVER THE COUNTER MEDICATION Take 2 capsules by mouth daily. zyflamend - spice mixture as an antiinflammatory    . OVER THE COUNTER MEDICATION Take 1-2 capsules by mouth daily as needed (for anxiety). True calm    . OVER THE COUNTER MEDICATION 2 tablets daily. Microlactin    . OVER THE COUNTER MEDICATION 2 tablets daily as needed. Quercetin with Bromelein    . OVER THE COUNTER MEDICATION at bedtime. Cherry tart    . pravastatin (PRAVACHOL) 20 MG tablet TAKE 1 TABLET EVERY EVENING 90 tablet 0  . ranitidine (ZANTAC) 150 MG tablet Take 150 mg by mouth 2 (two) times daily.    . Zinc 50 MG TABS Take 50 mg by mouth daily.     No current facility-administered medications on file prior to visit.    Health Maintenance:  Immunization History  Administered Date(s) Administered  . PPD Test 09/21/2013  . Tdap 09/21/2013   Tetanus: 2015 Pneumovax: Prevnar 13: Flu vaccine: gets free flu at work Zostavax:  DEXA: Colonoscopy: 2014 EGD: Eye Exam: yearly, wears glasses Dentist: q 6 months  Patient Care  Team: Lucky Cowboy, MD as PCP - General (Internal Medicine) Elmon Else, MD as Consulting Physician (Dermatology) Jake Bathe, MD as Consulting Physician (Cardiology) Jeani Hawking, MD as Consulting Physician (Gastroenterology)  Medical History:  Past Medical History:  Diagnosis Date  . Allergic rhinitis, cause unspecified   . GERD (gastroesophageal reflux disease)   . Hyperlipidemia   . Other abnormal glucose   Allergies No Known Allergies  SURGICAL HISTORY He  has a past surgical history that includes Tonsillectomy and Tonsillectomy and adenoidectomy. FAMILY HISTORY His family history includes Cancer in his father, maternal grandmother, and paternal grandfather; Diabetes in his maternal grandmother; Heart disease in his other; Hypertension in his father; Lupus in his mother; Stroke in his other. SOCIAL HISTORY He  reports that he has never smoked. He does not have any smokeless tobacco history on file. He reports that he does not drink alcohol or use drugs.   Review of Systems:  Review of Systems  Constitutional: Negative for chills, diaphoresis, fever, malaise/fatigue and weight  loss.  HENT: Negative.   Eyes: Negative.   Respiratory: Negative.   Cardiovascular: Negative.   Gastrointestinal: Negative.   Genitourinary: Negative.   Musculoskeletal: Positive for joint pain.  Skin: Negative.   Neurological: Negative.  Negative for weakness.  Endo/Heme/Allergies: Negative.   Psychiatric/Behavioral: Negative.     Physical Exam: Estimated body mass index is 31.2 kg/m as calculated from the following:   Height as of this encounter: 5' 8.5" (1.74 m).   Weight as of this encounter: 208 lb 3.2 oz (94.4 kg). BP 120/60   Pulse 80   Temp 97.5 F (36.4 C)   Resp 16   Ht 5' 8.5" (1.74 m)   Wt 208 lb 3.2 oz (94.4 kg)   SpO2 98%   BMI 31.20 kg/m  General Appearance: Well nourished, in no apparent distress.  Eyes: PERRLA, EOMs, conjunctiva no swelling or erythema, normal  fundi and vessels.  Sinuses: No Frontal/maxillary tenderness  ENT/Mouth: Ext aud canals clear, normal light reflex with TMs without erythema, bulging. Good dentition. No erythema, swelling, or exudate on post pharynx. Tonsils not swollen or erythematous. Hearing normal.  Neck: Supple, thyroid normal. No bruits  Respiratory: Respiratory effort normal, BS equal bilaterally without rales, rhonchi, wheezing or stridor.  Cardio: RRR without murmurs, rubs or gallops. Brisk peripheral pulses without edema.  Chest: symmetric, with normal excursions and percussion.  Abdomen: Soft, nontender, no guarding, rebound, hernias, masses, or organomegaly.  Lymphatics: Non tender without lymphadenopathy.  Genitourinary: defer Musculoskeletal: Full ROM all peripheral extremities,5/5 strength, and normal gait, + medial collateral ligament laxity, pain with palpation, no other laxity, no effusion, no erythema, no swelling or warmth. No crepitus, full ROM, no pain, good distal neurovascular exam.  Skin: Warm, dry without rashes, lesions, ecchymosis. Neuro: Cranial nerves intact, reflexes equal bilaterally. Normal muscle tone, no cerebellar symptoms. Sensation intact.  Psych: Awake and oriented X 3, normal affect, Insight and Judgment appropriate.   EKG: asymptomatic sinus bradycardia with IRBBB, unchanged from last year AORTA SCAN: defer  [name] 9:10 AM [facility]

## 2015-10-03 ENCOUNTER — Encounter: Payer: Self-pay | Admitting: Physician Assistant

## 2015-10-03 LAB — HEMOGLOBIN A1C
HEMOGLOBIN A1C: 5.5 % (ref <5.7)
MEAN PLASMA GLUCOSE: 111 mg/dL

## 2015-10-03 LAB — URINALYSIS, ROUTINE W REFLEX MICROSCOPIC
BILIRUBIN URINE: NEGATIVE
Glucose, UA: NEGATIVE
HGB URINE DIPSTICK: NEGATIVE
KETONES UR: NEGATIVE
Leukocytes, UA: NEGATIVE
NITRITE: NEGATIVE
PROTEIN: NEGATIVE
Specific Gravity, Urine: 1.007 (ref 1.001–1.035)
pH: 7 (ref 5.0–8.0)

## 2015-10-03 LAB — MICROALBUMIN / CREATININE URINE RATIO: CREATININE, URINE: 51 mg/dL (ref 20–370)

## 2015-10-03 LAB — VITAMIN D 25 HYDROXY (VIT D DEFICIENCY, FRACTURES): VIT D 25 HYDROXY: 55 ng/mL (ref 30–100)

## 2015-10-03 LAB — TESTOSTERONE: Testosterone: 452 ng/dL (ref 250–827)

## 2015-12-05 ENCOUNTER — Other Ambulatory Visit: Payer: Self-pay | Admitting: Internal Medicine

## 2016-01-08 ENCOUNTER — Other Ambulatory Visit: Payer: Self-pay

## 2016-01-08 DIAGNOSIS — E782 Mixed hyperlipidemia: Secondary | ICD-10-CM

## 2016-01-08 MED ORDER — PRAVASTATIN SODIUM 20 MG PO TABS: 20.0000 mg | ORAL_TABLET | Freq: Every evening | ORAL | 0 refills | Status: DC

## 2016-01-08 MED ORDER — MONTELUKAST SODIUM 10 MG PO TABS: ORAL_TABLET | ORAL | 1 refills | Status: DC

## 2016-01-28 ENCOUNTER — Encounter: Payer: Self-pay | Admitting: Physician Assistant

## 2016-04-10 ENCOUNTER — Encounter: Payer: Self-pay | Admitting: Physician Assistant

## 2016-04-10 ENCOUNTER — Ambulatory Visit (INDEPENDENT_AMBULATORY_CARE_PROVIDER_SITE_OTHER): Payer: BLUE CROSS/BLUE SHIELD | Admitting: Physician Assistant

## 2016-04-10 VITALS — BP 106/72 | HR 86 | Temp 97.5°F | Resp 16 | Ht 68.5 in | Wt 202.0 lb

## 2016-04-10 DIAGNOSIS — E782 Mixed hyperlipidemia: Secondary | ICD-10-CM

## 2016-04-10 DIAGNOSIS — Z79899 Other long term (current) drug therapy: Secondary | ICD-10-CM

## 2016-04-10 DIAGNOSIS — E559 Vitamin D deficiency, unspecified: Secondary | ICD-10-CM

## 2016-04-10 DIAGNOSIS — R7303 Prediabetes: Secondary | ICD-10-CM

## 2016-04-10 LAB — CBC WITH DIFFERENTIAL/PLATELET
BASOS ABS: 0 {cells}/uL (ref 0–200)
BASOS PCT: 0 %
EOS PCT: 1 %
Eosinophils Absolute: 53 cells/uL (ref 15–500)
HCT: 42.9 % (ref 38.5–50.0)
HEMOGLOBIN: 14.4 g/dL (ref 13.2–17.1)
LYMPHS ABS: 1908 {cells}/uL (ref 850–3900)
Lymphocytes Relative: 36 %
MCH: 29.7 pg (ref 27.0–33.0)
MCHC: 33.6 g/dL (ref 32.0–36.0)
MCV: 88.5 fL (ref 80.0–100.0)
MONOS PCT: 9 %
MPV: 9.9 fL (ref 7.5–12.5)
Monocytes Absolute: 477 cells/uL (ref 200–950)
NEUTROS ABS: 2862 {cells}/uL (ref 1500–7800)
Neutrophils Relative %: 54 %
PLATELETS: 198 10*3/uL (ref 140–400)
RBC: 4.85 MIL/uL (ref 4.20–5.80)
RDW: 14.1 % (ref 11.0–15.0)
WBC: 5.3 10*3/uL (ref 3.8–10.8)

## 2016-04-10 LAB — BASIC METABOLIC PANEL WITH GFR
BUN: 16 mg/dL (ref 7–25)
CHLORIDE: 103 mmol/L (ref 98–110)
CO2: 24 mmol/L (ref 20–31)
CREATININE: 1.14 mg/dL (ref 0.60–1.35)
Calcium: 9.7 mg/dL (ref 8.6–10.3)
GFR, EST NON AFRICAN AMERICAN: 81 mL/min (ref >=60)
GFR, Est African American: >89 mL/min (ref >=60)
Glucose, Bld: 94 mg/dL (ref 65–99)
POTASSIUM: 4.8 mmol/L (ref 3.5–5.3)
SODIUM: 139 mmol/L (ref 135–146)

## 2016-04-10 LAB — HEPATIC FUNCTION PANEL
ALK PHOS: 50 U/L (ref 40–115)
ALT: 20 U/L (ref 9–46)
AST: 18 U/L (ref 10–40)
Albumin: 4.7 g/dL (ref 3.6–5.1)
BILIRUBIN DIRECT: 0.1 mg/dL (ref <=0.2)
BILIRUBIN INDIRECT: 0.4 mg/dL (ref 0.2–1.2)
BILIRUBIN TOTAL: 0.5 mg/dL (ref 0.2–1.2)
Total Protein: 7.3 g/dL (ref 6.1–8.1)

## 2016-04-10 LAB — LIPID PANEL
CHOL/HDL RATIO: 3.8 ratio (ref <5.0)
Cholesterol: 179 mg/dL (ref <200)
HDL: 47 mg/dL (ref >40)
LDL CALC: 110 mg/dL — AB (ref <100)
Triglycerides: 110 mg/dL (ref <150)
VLDL: 22 mg/dL (ref <30)

## 2016-04-10 LAB — TSH: TSH: 1.24 m[IU]/L (ref 0.40–4.50)

## 2016-04-10 NOTE — Progress Notes (Signed)
Assessment and Plan:  Hypertension:  monitor blood pressure at home. Continue DASH diet.  Reminder to go to the ER if any CP, SOB, nausea, dizziness, severe HA, changes vision/speech, left arm numbness and tingling, and jaw pain. Cholesterol: Continue diet and exercise. Check cholesterol.  Pre-diabetes-Continue diet and exercise. Check A1C Vitamin D Def- check level and continue medications.    Continue diet and meds as discussed. Further disposition pending results of labs.  HPI 41 y.o. male  presents for 3 month follow up with hypertension, hyperlipidemia, prediabetes and vitamin D. His blood pressure has been controlled at home, today their BP is BP: 106/72 He does not workout. He denies chest pain, shortness of breath, dizziness.  He is on cholesterol medication, pravastatin 20 M,W,F and  other days and denies myalgias. His cholesterol is at goal, less than 100. The cholesterol last visit was:   Lab Results  Component Value Date   CHOL 179 10/02/2015   HDL 58 10/02/2015   LDLCALC 107 10/02/2015   TRIG 70 10/02/2015   CHOLHDL 3.1 10/02/2015  He has been working on diet and exercise for prediabetes, and denies paresthesia of the feet, polydipsia, polyuria and visual disturbances. Last A1C in the office was:  Lab Results  Component Value Date   HGBA1C 5.5 10/02/2015  Patient is on Vitamin D supplement.   Lab Results  Component Value Date   VD25OH 8 10/02/2015  He has GERD and in on zantac for this, he has allergies and possible asthma versus GERD, on Singulair/claritin and albuterol rarely.  BMI is Body mass index is 30.27 kg/m., he is working on diet and exercise. He has OSA but not on CPAP due to mask intolerance.  Wt Readings from Last 3 Encounters:  04/10/16 202 lb (91.6 kg)  10/02/15 208 lb 3.2 oz (94.4 kg)  07/16/15 208 lb (94.3 kg)   He has a history of testosterone deficiency and is on testosterone replacement. He states that the testosterone helps with his energy,  libido, muscle mass. Lab Results  Component Value Date   TESTOSTERONE 452 10/02/2015      Current Medications:  Current Outpatient Prescriptions on File Prior to Visit  Medication Sig Dispense Refill  . B Complex-C (SUPER B COMPLEX PO) Take by mouth daily.    . Cholecalciferol (VITAMIN D-3) 5000 UNITS TABS Take 5,000 Units by mouth daily.    Marland Kitchen loratadine (CLARITIN) 10 MG tablet Take 10 mg by mouth every morning.    . Magnesium 500 MG TABS Take 500 mg by mouth daily.    . montelukast (SINGULAIR) 10 MG tablet TAKE 1 TABLET DAILY FOR ALLERGY 90 tablet 1  . Omega-3 Fatty Acids (FISH OIL PO) Take by mouth. Two daily    . OVER THE COUNTER MEDICATION Take 2 capsules by mouth daily. zyflamend - spice mixture as an antiinflammatory    . OVER THE COUNTER MEDICATION Take 1-2 capsules by mouth daily as needed (for anxiety). True calm    . OVER THE COUNTER MEDICATION 2 tablets daily. Microlactin    . OVER THE COUNTER MEDICATION 2 tablets daily as needed. Quercetin with Bromelein    . pravastatin (PRAVACHOL) 20 MG tablet Take 1 tablet (20 mg total) by mouth every evening. 90 tablet 0  . ranitidine (ZANTAC) 150 MG tablet Take 150 mg by mouth 2 (two) times daily.    . Zinc 50 MG TABS Take 50 mg by mouth daily.     No current facility-administered medications on file prior  to visit.    Medical History:  Past Medical History:  Diagnosis Date  . Allergic rhinitis, cause unspecified   . GERD (gastroesophageal reflux disease)   . Hyperlipidemia   . Other abnormal glucose    Allergies: No Known Allergies   Review of Systems:  Review of Systems  Constitutional: Negative for chills, diaphoresis, fever, malaise/fatigue and weight loss.  HENT: Negative.   Eyes: Negative.   Respiratory: Negative.   Cardiovascular: Negative.   Gastrointestinal: Negative.   Genitourinary: Negative.   Musculoskeletal: Negative.   Skin: Negative.   Neurological: Negative.  Negative for weakness.       + snoring   Endo/Heme/Allergies: Negative.   Psychiatric/Behavioral: Negative.     Family history- Review and unchanged Social history- Review and unchanged Physical Exam: BP 106/72   Pulse 86   Temp 97.5 F (36.4 C)   Resp 16   Ht 5' 8.5" (1.74 m)   Wt 202 lb (91.6 kg)   SpO2 96%   BMI 30.27 kg/m  Wt Readings from Last 3 Encounters:  04/10/16 202 lb (91.6 kg)  10/02/15 208 lb 3.2 oz (94.4 kg)  07/16/15 208 lb (94.3 kg)   General Appearance: Well nourished, in no apparent distress. Eyes: PERRLA, EOMs, conjunctiva no swelling or erythema Sinuses: No Frontal/maxillary tenderness ENT/Mouth: Ext aud canals clear, TMs without erythema, bulging. No erythema, swelling, or exudate on post pharynx.  Tonsils not swollen or erythematous. Hearing normal.  Neck: Supple, thyroid normal.  Crowded mouth Respiratory: Respiratory effort normal, BS equal bilaterally without rales, rhonchi, wheezing or stridor.  Cardio: RRR with no MRGs. Brisk peripheral pulses without edema.  Abdomen: Soft, + BS, obese  Non tender, no guarding, rebound, hernias, masses. Lymphatics: Non tender without lymphadenopathy.  Musculoskeletal: Full ROM, 5/5 strength, normal gait.  Skin: Warm, dry without rashes, lesions, ecchymosis.  Neuro: Cranial nerves intact. Normal muscle tone, no cerebellar symptoms. Sensation intact.  Psych: Awake and oriented X 3, normal affect, Insight and Judgment appropriate.    [name], PA-C 8:51 AM [facility]

## 2016-04-11 LAB — MAGNESIUM: MAGNESIUM: 2.1 mg/dL (ref 1.5–2.5)

## 2016-04-11 LAB — VITAMIN D 25 HYDROXY (VIT D DEFICIENCY, FRACTURES): VIT D 25 HYDROXY: 74 ng/mL (ref 30–100)

## 2016-04-15 ENCOUNTER — Encounter: Payer: Self-pay | Admitting: Physician Assistant

## 2016-05-17 ENCOUNTER — Other Ambulatory Visit: Payer: Self-pay | Admitting: Physician Assistant

## 2016-05-17 DIAGNOSIS — E782 Mixed hyperlipidemia: Secondary | ICD-10-CM

## 2016-08-29 ENCOUNTER — Other Ambulatory Visit: Payer: Self-pay | Admitting: Physician Assistant

## 2016-09-25 ENCOUNTER — Ambulatory Visit (INDEPENDENT_AMBULATORY_CARE_PROVIDER_SITE_OTHER): Payer: BLUE CROSS/BLUE SHIELD | Admitting: Internal Medicine

## 2016-09-25 ENCOUNTER — Encounter: Payer: Self-pay | Admitting: Internal Medicine

## 2016-09-25 VITALS — BP 110/74 | HR 64 | Temp 97.8°F | Resp 18 | Ht 68.5 in | Wt 203.4 lb

## 2016-09-25 DIAGNOSIS — R1084 Generalized abdominal pain: Secondary | ICD-10-CM

## 2016-09-25 LAB — CBC WITH DIFFERENTIAL/PLATELET
BASOS PCT: 0.5 %
Basophils Absolute: 20 cells/uL (ref 0–200)
Eosinophils Absolute: 68 cells/uL (ref 15–500)
Eosinophils Relative: 1.7 %
HCT: 43.3 % (ref 38.5–50.0)
Hemoglobin: 14.2 g/dL (ref 13.2–17.1)
LYMPHS ABS: 1692 {cells}/uL (ref 850–3900)
MCH: 28.1 pg (ref 27.0–33.0)
MCHC: 32.8 g/dL (ref 32.0–36.0)
MCV: 85.7 fL (ref 80.0–100.0)
MPV: 10.2 fL (ref 7.5–12.5)
Monocytes Relative: 9.9 %
Neutro Abs: 1824 cells/uL (ref 1500–7800)
Neutrophils Relative %: 45.6 %
PLATELETS: 219 10*3/uL (ref 140–400)
RBC: 5.05 10*6/uL (ref 4.20–5.80)
RDW: 12.7 % (ref 11.0–15.0)
TOTAL LYMPHOCYTE: 42.3 %
WBC: 4 10*3/uL (ref 3.8–10.8)
WBCMIX: 396 {cells}/uL (ref 200–950)

## 2016-09-25 LAB — HEPATIC FUNCTION PANEL
AG Ratio: 1.7 (calc) (ref 1.0–2.5)
ALBUMIN MSPROF: 4.8 g/dL (ref 3.6–5.1)
ALKALINE PHOSPHATASE (APISO): 54 U/L (ref 40–115)
ALT: 19 U/L (ref 9–46)
AST: 17 U/L (ref 10–40)
BILIRUBIN INDIRECT: 0.2 mg/dL (ref 0.2–1.2)
BILIRUBIN TOTAL: 0.3 mg/dL (ref 0.2–1.2)
Bilirubin, Direct: 0.1 mg/dL (ref 0.0–0.2)
GLOBULIN: 2.8 g/dL (ref 1.9–3.7)
Total Protein: 7.6 g/dL (ref 6.1–8.1)

## 2016-09-25 LAB — AMYLASE: AMYLASE: 41 U/L (ref 21–101)

## 2016-09-25 MED ORDER — ESOMEPRAZOLE MAGNESIUM 40 MG PO CPDR: 40.0000 mg | DELAYED_RELEASE_CAPSULE | Freq: Every day | ORAL | 1 refills | Status: DC

## 2016-09-25 MED ORDER — DICYCLOMINE HCL 20 MG PO TABS: ORAL_TABLET | ORAL | 0 refills | Status: DC

## 2016-09-25 NOTE — Progress Notes (Signed)
  Subjective:    Patient ID: Adrian Francis, male    DOB: [birth year], 41 y.o.   MRN: [id number]  HPI  This nice 41 yo MWM with hx/o GERD had been stable on Ranitidine 150 mg bid until 4-5 days ago developed intermittent EG to LUQ discomfort with associated bloating, cramping and "burping". Denied N/V, but did have loose BM's early on, but now none in the last 3 days. No melena, hematochezia.fevers, chills, sweats, rash, resp or GU sx's.   Medication Sig  . B Complex-C (SUPER B COMPLEX PO) Take by mouth daily.  . Cholecalciferol (VITAMIN D-3) 5000 UNITS TABS Take 5,000 Units by mouth daily.  Marland Kitchen loratadine (CLARITIN) 10 MG tablet Take 10 mg by mouth every morning.  . Magnesium 500 MG TABS Take 500 mg by mouth daily.  . montelukast (SINGULAIR) 10 MG tablet TAKE 1 TABLET DAILY FOR    ALLERGY  . Omega-3 Fatty Acids (FISH OIL PO) Take by mouth. Two daily  . OVER THE COUNTER MEDICATION Take 2 capsules by mouth daily. zyflamend - spice mixture as an antiinflammatory  . OVER THE COUNTER MEDICATION Take 1-2 capsules by mouth daily as needed (for anxiety). True calm  . OVER THE COUNTER MEDICATION 2 tablets daily. Microlactin  . OVER THE COUNTER MEDICATION 2 tablets daily as needed. Quercetin with Bromelein  . pravastatin (PRAVACHOL) 20 MG tablet TAKE 1 TABLET EVERY EVENING  . ranitidine (ZANTAC) 150 MG tablet Take 150 mg by mouth 2 (two) times daily.  . Zinc 50 MG TABS Take 50 mg by mouth daily.  Marland Kitchen Zn-Pyg Afri-Nettle-Saw Palmet (SAW PALMETTO COMPLEX PO) Take by mouth daily.   No Known Allergies  Past Medical History:  Diagnosis Date  . Allergic rhinitis, cause unspecified   . GERD (gastroesophageal reflux disease)   . Hyperlipidemia   . Other abnormal glucose    Past Surgical History:  Procedure Laterality Date  . TONSILLECTOMY    . TONSILLECTOMY AND ADENOIDECTOMY     Review of Systems  10 point systems review negative except as above.    Objective:   Physical Exam  BP 110/74   Pulse 64    Temp 97.8 F (36.6 C)   Resp 18   Ht 5' 8.5" (1.74 m)   Wt 203 lb 6.4 oz (92.3 kg)   BMI 30.48 kg/m   HEENT - WNL. Neck - supple.  Chest - Clear equal BS. Cor - Nl HS. RRR w/o sig M. Abd - Soft , not distended and no point tenderness, guarding rebound orr organomegaly noted.  MS- FROM w/o deformities.  Gait Nl. Neuro -  Nl w/o focal abnormalities.    Assessment & Plan:   1. Generalized abdominal pain, r/o viral Gastroenteritis  - esomeprazole (NEXIUM) 40 MG capsule; Take 1 capsule (40 mg total) by mouth daily.  Dispense: 30 capsule; Refill: 1  - dicyclomine (BENTYL) 20 MG tablet; Take 1 tablet 3 x / day before meals for Nausea, cramping or bloating  Dispense: 90 tablet; Refill: 0  - CBC with Differential/Platelet - Hepatic function panel - Amylase  - discussed meds/SE's and prudent bland diet  - has f/u OV in 10 days

## 2016-09-26 ENCOUNTER — Encounter: Payer: Self-pay | Admitting: Internal Medicine

## 2016-10-02 NOTE — Progress Notes (Signed)
Complete Physical  Assessment and Plan: Prediabetes Discussed general issues about diabetes pathophysiology and management., Educational material distributed., Suggested low cholesterol diet., Encouraged aerobic exercise., Discussed foot care., Reminded to get yearly retinal exam. - CBC with Differential/Platelet - BASIC METABOLIC PANEL WITH GFR - Hepatic function panel - Hemoglobin A1c - Insulin, fasting - EKG 12-Lead   Mixed hyperlipidemia -continue medications, check lipids, decrease fatty foods, increase activity.  - Lipid panel - EKG 12-Lead  Obesity Obesity with co morbidities- long discussion about weight loss, diet, and exercise - TSH  Vitamin D deficiency - Vit D  25 hydroxy (rtn osteoporosis monitoring)   Medication management - Magnesium  Gastroesophageal reflux disease without esophagitis Continue PPI/H2 blocker, diet discussed   Allergic rhinitis, unspecified allergic rhinitis type Continue OTC allergy pills  Hypogonadism male Improved with diet, check labs.  - Testosterone   Encounter for general adult medical examination with abnormal findings  Asymptomatic Sinus bradycardia No CP, SOB, dizziness, history of syncope with tachycardia 2 years ago, normal stress test Dr. Anne Fu, does have family history of SSS/pacemakers, will monitor closely, call if any symptoms. Improved with weight loss   Screening for blood or protein in urine - Urinalysis, Routine w reflex microscopic (not at [facility]) - Microalbumin / creatinine urine ratio  Discussed med's effects and SE's. Screening labs and tests as requested with regular follow-up as recommended.  Over 40 minutes of exam, counseling, chart review and critical decision making was performed  HPI Patient presents for a complete physical.   His blood pressure has been controlled at home, today their BP is BP: 118/72 He does not workout, trying to walk a few times a week, goal 10,000 steps a day. He denies chest  pain, shortness of breath, dizziness.  His EKG shows sinus bradycardia at 48 which is new from previous EKG's, he is not on AV nodal blocking medications, he has OSA, not on CPAP due to intolerance. Dr. Anne Fu, had normal stress test 2 years ago.  He denies CP, SOB, dizziness, fatigue.  He had AB pain/virus 09/20, he is doing better.  He has bilateral knee and hip pain, better with exercise.  He is on cholesterol medication, pravastatin  3 days a week and the rest is 10 mg, on tricor and denies myalgias. His cholesterol is at goal. The cholesterol last visit was:   Lab Results  Component Value Date   CHOL 179 04/10/2016   HDL 47 04/10/2016   LDLCALC 110 (H) 04/10/2016   TRIG 110 04/10/2016   CHOLHDL 3.8 04/10/2016   He has been working on diet and exercise for prediabetes,  and denies paresthesia of the feet, polydipsia, polyuria and visual disturbances. Last A1C in the office was:  Lab Results  Component Value Date   HGBA1C 5.5 10/02/2015   Patient is on Vitamin D supplement.   Lab Results  Component Value Date   VD25OH 74 04/10/2016     Last PSA was: Lab Results  Component Value Date   PSA 1.15 09/21/2013   He has GERD.  BMI is Body mass index is 30.57 kg/m., he is working on diet and exercise. Has + OSA but can not tolerate a CPAP. He also has testosterone def but declines treatment.  Wt Readings from Last 3 Encounters:  10/06/16 207 lb (93.9 kg)  09/25/16 203 lb 6.4 oz (92.3 kg)  04/10/16 202 lb (91.6 kg)    Current Medications:  Current Outpatient Prescriptions on File Prior to Visit  Medication Sig Dispense  Refill  . B Complex-C (SUPER B COMPLEX PO) Take by mouth daily.    . Cholecalciferol (VITAMIN D-3) 5000 UNITS TABS Take 5,000 Units by mouth daily.    Marland Kitchen dicyclomine (BENTYL) 20 MG tablet Take 1 tablet 3 x / day before meals for Nausea, cramping or bloating 90 tablet 0  . esomeprazole (NEXIUM) 40 MG capsule Take 1 capsule (40 mg total) by mouth daily. 30 capsule  1  . loratadine (CLARITIN) 10 MG tablet Take 10 mg by mouth every morning.    . Magnesium 500 MG TABS Take 500 mg by mouth daily.    . montelukast (SINGULAIR) 10 MG tablet TAKE 1 TABLET DAILY FOR    ALLERGY 90 tablet 3  . Omega-3 Fatty Acids (FISH OIL PO) Take by mouth. Two daily    . OVER THE COUNTER MEDICATION Take 2 capsules by mouth daily. zyflamend - spice mixture as an antiinflammatory    . OVER THE COUNTER MEDICATION Take 1-2 capsules by mouth daily as needed (for anxiety). True calm    . OVER THE COUNTER MEDICATION 2 tablets daily. Microlactin    . OVER THE COUNTER MEDICATION 2 tablets daily as needed. Quercetin with Bromelein    . pravastatin (PRAVACHOL) 20 MG tablet TAKE 1 TABLET EVERY EVENING 90 tablet 1  . ranitidine (ZANTAC) 150 MG tablet Take 150 mg by mouth 2 (two) times daily.    . Zinc 50 MG TABS Take 50 mg by mouth daily.    Marland Kitchen Zn-Pyg Afri-Nettle-Saw Palmet (SAW PALMETTO COMPLEX PO) Take by mouth daily.     No current facility-administered medications on file prior to visit.    Health Maintenance:  Immunization History  Administered Date(s) Administered  . PPD Test 09/21/2013  . Tdap 09/21/2013   Tetanus: 2015 Pneumovax: Prevnar 13: Flu vaccine: gets free flu at work Zostavax:  DEXA: Colonoscopy: 2014 EGD: Eye Exam: yearly, wears glasses Dentist: q 6 months  Patient Care Team: Lucky Cowboy, MD as PCP - General (Internal Medicine) Elmon Else, MD as Consulting Physician (Dermatology) Jake Bathe, MD as Consulting Physician (Cardiology) Jeani Hawking, MD as Consulting Physician (Gastroenterology)  Medical History:  Past Medical History:  Diagnosis Date  . Allergic rhinitis, cause unspecified   . GERD (gastroesophageal reflux disease)   . Hyperlipidemia   . Other abnormal glucose    Allergies No Known Allergies  SURGICAL HISTORY He  has a past surgical history that includes Tonsillectomy and Tonsillectomy and adenoidectomy. FAMILY  HISTORY His family history includes Cancer in his father, maternal grandmother, and paternal grandfather; Diabetes in his maternal grandmother; Heart disease in his other; Hypertension in his father; Lupus in his mother; Stroke in his other. SOCIAL HISTORY He  reports that he has never smoked. He has never used smokeless tobacco. He reports that he does not drink alcohol or use drugs.   Review of Systems:  Review of Systems  Constitutional: Negative for chills, diaphoresis, fever, malaise/fatigue and weight loss.  HENT: Negative.   Eyes: Negative.   Respiratory: Negative.   Cardiovascular: Negative.   Gastrointestinal: Negative.   Genitourinary: Negative.   Musculoskeletal: Positive for joint pain.  Skin: Negative.   Neurological: Negative.  Negative for weakness.  Endo/Heme/Allergies: Negative.   Psychiatric/Behavioral: Negative.     Physical Exam: Estimated body mass index is 30.57 kg/m as calculated from the following:   Height as of this encounter:  (1.753 m).   Weight as of this encounter: 207 lb (93.9 kg). BP 118/72  Pulse 78   Temp (!) 97.4 F (36.3 C)   Ht  (1.753 m)   Wt 207 lb (93.9 kg)   SpO2 96%   BMI 30.57 kg/m  General Appearance: Well nourished, in no apparent distress.  Eyes: PERRLA, EOMs, conjunctiva no swelling or erythema, normal fundi and vessels.  Sinuses: No Frontal/maxillary tenderness  ENT/Mouth: Ext aud canals clear, normal light reflex with TMs without erythema, bulging. Good dentition. No erythema, swelling, or exudate on post pharynx. Tonsils not swollen or erythematous. Hearing normal.  Neck: Supple, thyroid normal. No bruits  Respiratory: Respiratory effort normal, BS equal bilaterally without rales, rhonchi, wheezing or stridor.  Cardio: RRR without murmurs, rubs or gallops. Brisk peripheral pulses without edema.  Chest: symmetric, with normal excursions and percussion.  Abdomen: Soft, nontender, no guarding, rebound, hernias,  masses, or organomegaly.  Lymphatics: Non tender without lymphadenopathy.  Genitourinary: defer Musculoskeletal: Full ROM all peripheral extremities,5/5 strength, and normal gait. ROM, no pain, good distal neurovascular exam.  Skin: Warm, dry without rashes, lesions, ecchymosis. Neuro: Cranial nerves intact, reflexes equal bilaterally. Normal muscle tone, no cerebellar symptoms. Sensation intact.  Psych: Awake and oriented X 3, normal affect, Insight and Judgment appropriate.   EKG: asymptomatic sinus bradycardia with IRBBB, unchanged from last year AORTA SCAN: defer  [name] 9:20 AM [facility]

## 2016-10-06 ENCOUNTER — Encounter: Payer: Self-pay | Admitting: Physician Assistant

## 2016-10-06 ENCOUNTER — Ambulatory Visit (INDEPENDENT_AMBULATORY_CARE_PROVIDER_SITE_OTHER): Payer: BLUE CROSS/BLUE SHIELD | Admitting: Physician Assistant

## 2016-10-06 VITALS — BP 118/72 | HR 78 | Temp 97.4°F | Ht 69.0 in | Wt 207.0 lb

## 2016-10-06 DIAGNOSIS — E6609 Other obesity due to excess calories: Secondary | ICD-10-CM

## 2016-10-06 DIAGNOSIS — I10 Essential (primary) hypertension: Secondary | ICD-10-CM

## 2016-10-06 DIAGNOSIS — R7303 Prediabetes: Secondary | ICD-10-CM

## 2016-10-06 DIAGNOSIS — Z136 Encounter for screening for cardiovascular disorders: Secondary | ICD-10-CM

## 2016-10-06 DIAGNOSIS — E559 Vitamin D deficiency, unspecified: Secondary | ICD-10-CM | POA: Diagnosis not present

## 2016-10-06 DIAGNOSIS — E291 Testicular hypofunction: Secondary | ICD-10-CM

## 2016-10-06 DIAGNOSIS — K219 Gastro-esophageal reflux disease without esophagitis: Secondary | ICD-10-CM

## 2016-10-06 DIAGNOSIS — Z79899 Other long term (current) drug therapy: Secondary | ICD-10-CM | POA: Diagnosis not present

## 2016-10-06 DIAGNOSIS — Z0000 Encounter for general adult medical examination without abnormal findings: Secondary | ICD-10-CM | POA: Diagnosis not present

## 2016-10-06 DIAGNOSIS — Z1389 Encounter for screening for other disorder: Secondary | ICD-10-CM

## 2016-10-06 DIAGNOSIS — Z0001 Encounter for general adult medical examination with abnormal findings: Secondary | ICD-10-CM

## 2016-10-06 DIAGNOSIS — Z6830 Body mass index (BMI) 30.0-30.9, adult: Secondary | ICD-10-CM

## 2016-10-06 DIAGNOSIS — E782 Mixed hyperlipidemia: Secondary | ICD-10-CM

## 2016-10-06 DIAGNOSIS — R001 Bradycardia, unspecified: Secondary | ICD-10-CM

## 2016-10-06 NOTE — Patient Instructions (Signed)
Arthritis Arthritis is a term that is commonly used to refer to joint pain or joint disease. There are more than 100 types of arthritis. What are the causes? The most common cause of this condition is wear and tear of a joint. Other causes include:  Gout.  Inflammation of a joint.  An infection of a joint.  Sprains and other injuries near the joint.  A drug reaction or allergic reaction.  In some cases, the cause may not be known. What are the signs or symptoms? The main symptom of this condition is pain in the joint with movement. Other symptoms include:  Redness, swelling, or stiffness at a joint.  Warmth coming from the joint.  Fever.  Overall feeling of illness.  How is this diagnosed? This condition may be diagnosed with a physical exam and tests, including:  Blood tests.  Urine tests.  Imaging tests, such as MRI, X-rays, or a CT scan.  Sometimes, fluid is removed from a joint for testing. How is this treated? Treatment for this condition may involve:  Treatment of the cause, if it is known.  Rest.  Raising (elevating) the joint.  Applying cold or hot packs to the joint.  Medicines to improve symptoms and reduce inflammation.  Injections of a steroid such as cortisone into the joint to help reduce pain and inflammation.  Depending on the cause of your arthritis, you may need to make lifestyle changes to reduce stress on your joint. These changes may include exercising more and losing weight. Follow these instructions at home: Medicines  Take over-the-counter and prescription medicines only as told by your health care provider.  Do not take aspirin to relieve pain if gout is suspected. Activity  Rest your joint if told by your health care provider. Rest is important when your disease is active and your joint feels painful, swollen, or stiff.  Avoid activities that make the pain worse. It is important to balance activity with rest.  Exercise your  joint regularly with range-of-motion exercises as told by your health care provider. Try doing low-impact exercise, such as: ? Swimming. ? Water aerobics. ? Biking. ? Walking. Joint Care   If your joint is swollen, keep it elevated if told by your health care provider.  If your joint feels stiff in the morning, try taking a warm shower.  If directed, apply heat to the joint. If you have diabetes, do not apply heat without permission from your health care provider. ? Put a towel between the joint and the hot pack or heating pad. ? Leave the heat on the area for 20-30 minutes.  If directed, apply ice to the joint: ? Put ice in a plastic bag. ? Place a towel between your skin and the bag. ? Leave the ice on for 20 minutes, 2-3 times per day.  Keep all follow-up visits as told by your health care provider. This is important. Contact a health care provider if:  The pain gets worse.  You have a fever. Get help right away if:  You develop severe joint pain, swelling, or redness.  Many joints become painful and swollen.  You develop severe back pain.  You develop severe weakness in your leg.  You cannot control your bladder or bowels. This information is not intended to replace advice given to you by your health care provider. Make sure you discuss any questions you have with your health care provider. Document Released: 01/31/2004 Document Revised: 05/31/2015 Document Reviewed: 03/20/2014 Elsevier Interactive Patient   Education  2018 [employer]   9 Ways to Naturally Increase Testosterone Levels  1.   Lose Weight If you're overweight, shedding the excess pounds may increase your testosterone levels, according to research presented at the Endocrine Society's 2012 meeting. Overweight men are more likely to have low testosterone levels to begin with, so this is an important trick to increase your body's testosterone production when you need it most.  2.   High-Intensity  Exercise like Peak Fitness  Short intense exercise has a proven positive effect on increasing testosterone levels and preventing its decline. That's unlike aerobics or prolonged moderate exercise, which have shown to have negative or no effect on testosterone levels. Having a whey protein meal after exercise can further enhance the satiety/testosterone-boosting impact (hunger hormones cause the opposite effect on your testosterone and libido). Here's a summary of what a typical high-intensity Peak Fitness routine might look like: " Warm up for three minutes  " Exercise as hard and fast as you can for 30 seconds. You should feel like you couldn't possibly go on another few seconds  " Recover at a slow to moderate pace for 90 seconds  " Repeat the high intensity exercise and recovery 7 more times .  3.   Consume Plenty of Zinc The mineral zinc is important for testosterone production, and supplementing your diet for as little as six weeks has been shown to cause a marked improvement in testosterone among men with low levels.1 Likewise, research has shown that restricting dietary sources of zinc leads to a significant decrease in testosterone, while zinc supplementation increases it2 -- and even protects men from exercised-induced reductions in testosterone levels.3 It's estimated that up to 45 percent of adults over the age of 60 may have lower than recommended zinc intakes; even when dietary supplements were added in, an estimated 20-25 percent of older adults still had inadequate zinc intakes, according to a [employer] and Nutrition Examination Survey.4 Your diet is the best source of zinc; along with protein-rich foods like meats and fish, other good dietary sources of zinc include raw milk, raw cheese, beans, and yogurt or kefir made from raw milk. It can be difficult to obtain enough dietary zinc if you're a vegetarian, and also for meat-eaters as well, largely because of conventional farming  methods that rely heavily on chemical fertilizers and pesticides. These chemicals deplete the soil of nutrients ... nutrients like zinc that must be absorbed by plants in order to be passed on to you. In many cases, you may further deplete the nutrients in your food by the way you prepare it. For most food, cooking it will drastically reduce its levels of nutrients like zinc . particularly over-cooking, which many people do. If you decide to use a zinc supplement, stick to a dosage of less than 40 mg a day, as this is the recommended adult upper limit. Taking too much zinc can interfere with your body's ability to absorb other minerals, especially copper, and may cause nausea as a side effect.  4.   Strength Training In addition to Peak Fitness, strength training is also known to boost testosterone levels, provided you are doing so intensely enough. When strength training to boost testosterone, you'll want to increase the weight and lower your number of reps, and then focus on exercises that work a large number of muscles, such as dead lifts or squats.  You can "turbo-charge" your weight training by going slower. By slowing down your movement, you're actually  turning it into a high-intensity exercise. Super Slow movement allows your muscle, at the microscopic level, to access the maximum number of cross-bridges between the protein filaments that produce movement in the muscle.   5.   Optimize Your Vitamin D Levels Vitamin D, a steroid hormone, is essential for the healthy development of the nucleus of the sperm cell, and helps maintain semen quality and sperm count. Vitamin D also increases levels of testosterone, which may boost libido. In one study, overweight men who were given vitamin D supplements had a significant increase in testosterone levels after one year.5   6.   Reduce Stress When you're under a lot of stress, your body releases high levels of the stress hormone cortisol. This hormone  actually blocks the effects of testosterone,6 presumably because, from a biological standpoint, testosterone-associated behaviors (mating, competing, aggression) may have lowered your chances of survival in an emergency (hence, the "fight or flight" response is dominant, courtesy of cortisol).  7.   Limit or Eliminate Sugar from Your Diet Testosterone levels decrease after you eat sugar, which is likely because the sugar leads to a high insulin level, another factor leading to low testosterone.7 Based on USDA estimates, the average American consumes 12 teaspoons of sugar a day, which equates to about TWO TONS of sugar during a lifetime.  8.   Eat Healthy Fats By healthy, this means not only mon- and polyunsaturated fats, like that found in avocadoes and nuts, but also saturated, as these are essential for building testosterone. Research shows that a diet with less than 40 percent of energy as fat (and that mainly from animal sources, i.e. saturated) lead to a decrease in testosterone levels.8 My personal diet is about 60-70 percent healthy fat, and other experts agree that the ideal diet includes somewhere between 50-70 percent fat.  It's important to understand that your body requires saturated fats from animal and vegetable sources (such as meat, dairy, certain oils, and tropical plants like coconut) for optimal functioning, and if you neglect this important food group in favor of sugar, grains and other starchy carbs, your health and weight are almost guaranteed to suffer. Examples of healthy fats you can eat more of to give your testosterone levels a boost include: Olives and Olive oil  Coconuts and coconut oil Butter made from raw grass-fed organic milk Raw nuts, such as, almonds or pecans Organic pastured egg yolks Avocados Grass-fed meats Palm oil Unheated organic nut oils   9.   Boost Your Intake of Branch Chain Amino Acids (BCAA) from Foods Like Whey Protein Research suggests that BCAAs  result in higher testosterone levels, particularly when taken along with resistance training.9 While BCAAs are available in supplement form, you'll find the highest concentrations of BCAAs like leucine in dairy products - especially quality cheeses and whey protein. Even when getting leucine from your natural food supply, it's often wasted or used as a building block instead of an anabolic agent. So to create the correct anabolic environment, you need to boost leucine consumption way beyond mere maintenance levels. That said, keep in mind that using leucine as a free form amino acid can be highly counterproductive as when free form amino acids are artificially administrated, they rapidly enter your circulation while disrupting insulin function, and impairing your body's glycemic control. Food-based leucine is really the ideal form that can benefit your muscles without side effects.

## 2016-10-07 LAB — MICROALBUMIN / CREATININE URINE RATIO
CREATININE, URINE: 23 mg/dL (ref 20–320)
Microalb, Ur: <0.2 mg/dL

## 2016-10-07 LAB — CBC WITH DIFFERENTIAL/PLATELET
BASOS ABS: 19 {cells}/uL (ref 0–200)
Basophils Relative: 0.4 %
EOS ABS: 118 {cells}/uL (ref 15–500)
EOS PCT: 2.5 %
HCT: 41.4 % (ref 38.5–50.0)
Hemoglobin: 13.6 g/dL (ref 13.2–17.1)
LYMPHS ABS: 1716 {cells}/uL (ref 850–3900)
MCH: 28 pg (ref 27.0–33.0)
MCHC: 32.9 g/dL (ref 32.0–36.0)
MCV: 85.2 fL (ref 80.0–100.0)
MPV: 10.2 fL (ref 7.5–12.5)
Monocytes Relative: 10.3 %
NEUTROS PCT: 50.3 %
Neutro Abs: 2364 cells/uL (ref 1500–7800)
Platelets: 206 10*3/uL (ref 140–400)
RBC: 4.86 10*6/uL (ref 4.20–5.80)
RDW: 12.6 % (ref 11.0–15.0)
Total Lymphocyte: 36.5 %
WBC: 4.7 10*3/uL (ref 3.8–10.8)
WBCMIX: 484 {cells}/uL (ref 200–950)

## 2016-10-07 LAB — HEPATIC FUNCTION PANEL
AG Ratio: 1.9 (calc) (ref 1.0–2.5)
ALKALINE PHOSPHATASE (APISO): 50 U/L (ref 40–115)
ALT: 14 U/L (ref 9–46)
AST: 15 U/L (ref 10–40)
Albumin: 4.5 g/dL (ref 3.6–5.1)
BILIRUBIN DIRECT: 0.1 mg/dL (ref 0.0–0.2)
BILIRUBIN INDIRECT: 0.2 mg/dL (ref 0.2–1.2)
BILIRUBIN TOTAL: 0.3 mg/dL (ref 0.2–1.2)
Globulin: 2.4 g/dL (calc) (ref 1.9–3.7)
Total Protein: 6.9 g/dL (ref 6.1–8.1)

## 2016-10-07 LAB — LIPID PANEL
Cholesterol: 192 mg/dL (ref <200)
HDL: 48 mg/dL (ref >40)
LDL Cholesterol (Calc): 126 mg/dL (calc) — ABNORMAL HIGH
NON-HDL CHOLESTEROL (CALC): 144 mg/dL — AB (ref <130)
Total CHOL/HDL Ratio: 4 (calc) (ref <5.0)
Triglycerides: 81 mg/dL (ref <150)

## 2016-10-07 LAB — TSH: TSH: 0.92 m[IU]/L (ref 0.40–4.50)

## 2016-10-07 LAB — VITAMIN D 25 HYDROXY (VIT D DEFICIENCY, FRACTURES): VIT D 25 HYDROXY: 62 ng/mL (ref 30–100)

## 2016-10-07 LAB — MAGNESIUM: Magnesium: 1.9 mg/dL (ref 1.5–2.5)

## 2016-10-07 LAB — BASIC METABOLIC PANEL WITH GFR
BUN: 17 mg/dL (ref 7–25)
CALCIUM: 9.2 mg/dL (ref 8.6–10.3)
CHLORIDE: 104 mmol/L (ref 98–110)
CO2: 29 mmol/L (ref 20–32)
Creat: 0.98 mg/dL (ref 0.60–1.35)
GFR, EST AFRICAN AMERICAN: 113 mL/min/{1.73_m2} (ref >=60)
GFR, Est Non African American: 97 mL/min/{1.73_m2} (ref >=60)
Glucose, Bld: 101 mg/dL — ABNORMAL HIGH (ref 65–99)
POTASSIUM: 4.6 mmol/L (ref 3.5–5.3)
SODIUM: 140 mmol/L (ref 135–146)

## 2016-10-07 LAB — HEMOGLOBIN A1C
Hgb A1c MFr Bld: 5.4 % of total Hgb (ref <5.7)
Mean Plasma Glucose: 108 (calc)
eAG (mmol/L): 6 (calc)

## 2016-12-17 ENCOUNTER — Telehealth: Payer: Self-pay | Admitting: Family

## 2016-12-17 DIAGNOSIS — R300 Dysuria: Secondary | ICD-10-CM

## 2016-12-17 DIAGNOSIS — R6883 Chills (without fever): Secondary | ICD-10-CM

## 2016-12-17 NOTE — Progress Notes (Signed)
Based on what you shared with me it looks like you have a serious condition that should be evaluated in a face to face office visit.  NOTE: Even if you have entered your credit card information for this eVisit, you will not be charged.   If you are having a true medical emergency please call 911.  If you need an urgent face to face visit, [place] has four urgent care centers for your convenience.  If you need care fast and have a high deductible or no insurance consider:   [email or web address]  [phone]  [address], [address] [place], [place] [address] 8 am to 8 pm Monday-Friday 10 am to 4 pm Saturday-Sunday   The following sites will take your  insurance:    . [facility]  [phone] Get Driving Directions Find a Provider at this Location  [address] [place], [place] [address] . 10 am to 8 pm Monday-Friday . 12 pm to 8 pm Saturday-Sunday   . [facility]  [phone] Get Driving Directions Find a Provider at this Location  [address], [address] [place], [place] [address] . 8 am to 8 pm Monday-Friday . 9 am to 6 pm Saturday . 11 am to 6 pm Sunday   .  [facility] [place]  [phone] Get Driving Directions  [address] [place], [place] [address] . 8 am to 8 pm Monday-Friday . 8 am to 4 pm Saturday-Sunday   Your e-visit answers were reviewed by a board certified advanced clinical practitioner to complete your personal care plan.  Thank you for using e-Visits.  

## 2016-12-18 ENCOUNTER — Ambulatory Visit: Payer: BLUE CROSS/BLUE SHIELD | Admitting: Physician Assistant

## 2016-12-18 ENCOUNTER — Encounter: Payer: Self-pay | Admitting: Physician Assistant

## 2016-12-18 VITALS — BP 108/70 | HR 72 | Temp 97.3°F | Resp 14 | Ht 69.0 in | Wt 209.0 lb

## 2016-12-18 DIAGNOSIS — R300 Dysuria: Secondary | ICD-10-CM | POA: Diagnosis not present

## 2016-12-18 DIAGNOSIS — R509 Fever, unspecified: Secondary | ICD-10-CM

## 2016-12-18 MED ORDER — SULFAMETHOXAZOLE-TRIMETHOPRIM 800-160 MG PO TABS
1.0000 | ORAL_TABLET | Freq: Two times a day (BID) | ORAL | 0 refills | Status: DC
Start: 2016-12-18 — End: 2017-04-06

## 2016-12-18 NOTE — Patient Instructions (Signed)
Prostatitis Prostatitis is swelling or inflammation of the prostate gland. The prostate is a walnut-sized gland that is involved in the production of semen. It is located below a man's bladder, in front of the rectum. There are four types of prostatitis:  Chronic nonbacterial prostatitis. This is the most common type of prostatitis. It may be associated with a viral infection or autoimmune disorder.  Acute bacterial prostatitis. This is the least common type of prostatitis. It starts quickly and is usually associated with a bladder infection, high fever, and shaking chills. It can occur at any age.  Chronic bacterial prostatitis. This type usually results from acute bacterial prostatitis that happens repeatedly (is recurrent) or has not been treated properly. It can occur in men of any age but is most common among middle-aged men whose prostate has begun to get larger. The symptoms are not as severe as symptoms caused by acute bacterial prostatitis.  Prostatodynia or chronic pelvic pain syndrome (CPPS). This type is also called pelvic floor disorder. It is associated with increased muscular tone in the pelvis surrounding the prostate.  What are the causes? Bacterial prostatitis is caused by infection from bacteria. Chronic nonbacterial prostatitis may be caused by:  Urinary tract infections (UTIs).  Nerve damage.  A response by the body's disease-fighting system (autoimmune response).  Chemicals in the urine.  The causes of the other types of prostatitis are usually not known. What are the signs or symptoms? Symptoms of this condition vary depending upon the type of prostatitis. If you have acute bacterial prostatitis, you may experience:  Urinary symptoms, such as: ? Painful urination. ? Burning during urination. ? Frequent and sudden urges to urinate. ? Inability to start urinating. ? A weak or interrupted stream of urine.  Vomiting.  Nausea.  Fever.  Chills.  Inability to  empty the bladder completely.  Pain in the: ? Muscles or joints. ? Lower back. ? Lower abdomen.  If you have any of the other types of prostatitis, you may experience:  Urinary symptoms, such as: ? Sudden urges to urinate. ? Frequent urination. ? Difficulty starting urination. ? Weak urine stream. ? Dribbling after urination.  Discharge from the urethra. The urethra is a tube that opens at the end of the penis.  Pain in the: ? Testicles. ? Penis or tip of the penis. ? Rectum. ? Area in front of the rectum and below the scrotum (perineum).  Problems with sexual function.  Painful ejaculation.  Bloody semen.  How is this diagnosed? This condition may be diagnosed based on:  A physical and medical exam.  Your symptoms.  A urine test to check for bacteria.  An exam in which a health care provider uses a finger to feel the prostate (digital rectal exam).  A test of a sample of semen.  Blood tests.  Ultrasound.  Removal of prostate tissue to be examined under a microscope (biopsy).  Tests to check how your body handles urine (urodynamic tests).  A test to look inside your bladder or urethra (cystoscopy).  How is this treated? Treatment for this condition depends on the type of prostatitis. Treatment may involve:  Medicines to relieve pain or inflammation.  Medicines to help relax your muscles.  Physical therapy.  Heat therapy.  Techniques to help you control certain body functions (biofeedback).  Relaxation exercises.  Antibiotic medicine, if your condition is caused by bacteria.  Warm water baths (sitz baths). Sitz baths help with relaxing your pelvic floor muscles, which helps to relieve  pressure on the prostate.  Follow these instructions at home:  Take over-the-counter and prescription medicines only as told by your health care provider.  If you were prescribed an antibiotic, take it as told by your health care provider. Do not stop taking the  antibiotic even if you start to feel better.  If physical therapy, biofeedback, or relaxation exercises were prescribed, do exercises as instructed.  Take sitz baths as directed by your health care provider. For a sitz bath, sit in warm water that is deep enough to cover your hips and buttocks.  Keep all follow-up visits as told by your health care provider. This is important. Contact a health care provider if:  Your symptoms get worse.  You have a fever. Get help right away if:  You have chills.  You feel nauseous.  You vomit.  You feel light-headed or feel like you are going to faint.  You are unable to urinate.  You have blood or blood clots in your urine. This information is not intended to replace advice given to you by your health care provider. Make sure you discuss any questions you have with your health care provider. Document Released: 12/21/1999 Document Revised: 09/13/2015 Document Reviewed: 09/13/2015 Elsevier Interactive Patient Education  2017 [employer]   Urinary Frequency, Adult Urinary frequency means urinating more often than usual. People with urinary frequency urinate at least 8 times in 24 hours, even if they drink a normal amount of fluid. Although they urinate more often than normal, the total amount of urine produced in a day may be normal. Urinary frequency is also called pollakiuria. What are the causes? This condition may be caused by:  A urinary tract infection.  Obesity.  Bladder problems, such as bladder stones.  Caffeine or alcohol.  Eating food or drinking fluids that irritate the bladder. These include coffee, tea, soda, artificial sweeteners, citrus, tomato-based foods, and chocolate.  Certain medicines, such as medicines that help the body get rid of extra fluid (diuretics).  Muscle or nerve weakness.  Overactive bladder.  Chronic diabetes.  Interstitial cystitis.  In men, problems with the prostate, such as an enlarged  prostate.  In women, pregnancy.  In some cases, the cause may not be known. What increases the risk? This condition is more likely to develop in:  Women who have gone through menopause.  Men with prostate problems.  People with a disease or injury that affects the nerves or spinal cord.  People who have or have had a condition that affects the brain, such as a stroke.  What are the signs or symptoms? Symptoms of this condition include:  Feeling an urgent need to urinate often. The stress and anxiety of needing to find a bathroom quickly can make this urge worse.  Urinating 8 or more times in 24 hours.  Urinating as often as every 1 to 2 hours.  How is this diagnosed? This condition is diagnosed based on your symptoms, your medical history, and a physical exam. You may have tests, such as:  Blood tests.  Urine tests.  Imaging tests, such as X-rays or ultrasounds.  A bladder test.  A test of your neurological system. This is the body system that senses the need to urinate.  A test to check for problems in the urethra and bladder called cystoscopy.  You may also be asked to keep a bladder diary. A bladder diary is a record of what you eat and drink, how often you urinate, and how much  you urinate. You may need to see a health care provider who specializes in conditions of the urinary tract (urologist) or kidneys (nephrologist). How is this treated? Treatment for this condition depends on the cause. Sometimes the condition goes away on its own and treatment is not necessary. If treatment is needed, it may include:  Taking medicine.  Learning exercises that strengthen the muscles that help control urination.  Following a bladder training program. This may include: ? Learning to delay going to the bathroom. ? Double urinating (voiding). This helps if you are not completely emptying your bladder. ? Scheduled voiding.  Making diet changes, such as: ? Avoiding  caffeine. ? Drinking fewer fluids, especially alcohol. ? Not drinking in the evening. ? Not having foods or drinks that may irritate the bladder. ? Eating foods that help prevent or ease constipation. Constipation can make this condition worse.  Having the nerves in your bladder stimulated. There are two options for stimulating the nerves to your bladder: ? Outpatient electrical nerve stimulation. This is done by your health care provider. ? Surgery to implant a bladder pacemaker. The pacemaker helps to control the urge to urinate.  Follow these instructions at home:  Keep a bladder diary if told to by your health care provider.  Take over-the-counter and prescription medicines only as told by your health care provider.  Do any exercises as told by your health care provider.  Follow a bladder training program as told by your health care provider.  Make any recommended diet changes.  Keep all follow-up visits as told by your health care provider. This is important. Contact a health care provider if:  You start urinating more often.  You feel pain or irritation when you urinate.  You notice blood in your urine.  Your urine looks cloudy.  You develop a fever.  You begin vomiting. Get help right away if:  You are unable to urinate. This information is not intended to replace advice given to you by your health care provider. Make sure you discuss any questions you have with your health care provider. Document Released: 10/19/2008 Document Revised: 01/24/2015 Document Reviewed: 07/19/2014 Elsevier Interactive Patient Education  [employer]2018 [employer]

## 2016-12-18 NOTE — Progress Notes (Signed)
Subjective:    Patient ID: Adrian Francis, male    DOB: 1975/04/06, 41 y.o.   MRN: [id number]  HPI [id number] Francis y.o. WM presents with fever, chills, dysuria x Sunday evening.  Sunday had fever, chills, some muscle aches and sinus pain. Sinus congestion has resovled started to have lower back pain, muscle aches, headache and dysuria x yesterday.  No nausea, vomiting,   Blood pressure 108/70, pulse 72, temperature (!) 97.3 F (36.3 C), resp. rate 14, height 5\' 9"  (1.753 m), weight 209 lb (94.8 kg), SpO2 98 %.  Medications Current Outpatient Medications on File Prior to Visit  Medication Sig  . B Complex-C (SUPER B COMPLEX PO) Take by mouth daily.  . Cholecalciferol (VITAMIN D-3) 5000 UNITS TABS Take 5,000 Units by mouth daily.  Marland Kitchen. esomeprazole (NEXIUM) 40 MG capsule Take 1 capsule (40 mg total) by mouth daily.  Marland Kitchen. loratadine (CLARITIN) 10 MG tablet Take 10 mg by mouth every morning.  . montelukast (SINGULAIR) 10 MG tablet TAKE 1 TABLET DAILY FOR    ALLERGY  . Omega-3 Fatty Acids (FISH OIL PO) Take by mouth. Two daily  . OVER THE COUNTER MEDICATION Take 2 capsules by mouth daily. zyflamend - spice mixture as an antiinflammatory  . OVER THE COUNTER MEDICATION Take 1-2 capsules by mouth daily as needed (for anxiety). True calm  . OVER THE COUNTER MEDICATION 2 tablets daily. Microlactin  . OVER THE COUNTER MEDICATION 2 tablets daily as needed. Quercetin with Bromelein  . pravastatin (PRAVACHOL) 20 MG tablet TAKE 1 TABLET EVERY EVENING  . ranitidine (ZANTAC) 150 MG tablet Take 150 mg by mouth 2 (two) times daily.  . Zinc 50 MG TABS Take 50 mg by mouth daily.  Marland Kitchen. Zn-Pyg Afri-Nettle-Saw Palmet (SAW PALMETTO COMPLEX PO) Take by mouth daily.  Marland Kitchen. dicyclomine (BENTYL) 20 MG tablet Take 1 tablet 3 x / day before meals for Nausea, cramping or bloating   No current facility-administered medications on file prior to visit.     Problem list He has Prediabetes; GERD (gastroesophageal reflux disease); Allergic  rhinitis; Mixed hyperlipidemia; Obesity; Vitamin D deficiency; Medication management; Hypogonadism male; and Sinus bradycardia on ECG on their problem list.   Review of Systems  Constitutional: Positive for chills and fever. Negative for activity change, appetite change, diaphoresis, fatigue and unexpected weight change.  HENT: Negative.   Respiratory: Negative.   Cardiovascular: Negative.   Gastrointestinal: Negative.   Genitourinary: Positive for dysuria, frequency and urgency. Negative for decreased urine volume, difficulty urinating, discharge, enuresis, flank pain, genital sores, hematuria, penile pain, penile swelling, scrotal swelling and testicular pain.  Musculoskeletal: Positive for back pain and myalgias. Negative for arthralgias, gait problem, joint swelling, neck pain and neck stiffness.  Skin: Negative.   Neurological: Positive for headaches. Negative for dizziness, tremors, seizures, syncope, facial asymmetry, speech difficulty, weakness, light-headedness and numbness.       Objective:   Physical Exam  Constitutional: He is oriented to person, place, and time. He appears well-developed and well-nourished.  HENT:  Head: Normocephalic and atraumatic.  Right Ear: External ear normal.  Left Ear: External ear normal.  Mouth/Throat: Oropharynx is clear and moist.  Eyes: Conjunctivae and EOM are normal. Pupils are equal, round, and reactive to light.  Neck: Normal range of motion. Neck supple.  Cardiovascular: Normal rate, regular rhythm and normal heart sounds.  Pulmonary/Chest: Effort normal and breath sounds normal.  Abdominal: Soft. Bowel sounds are normal. He exhibits no mass. There is tenderness (suprapubic tenderness). There is  no rebound and no guarding.  Musculoskeletal: Normal range of motion.  Neurological: He is alert and oriented to person, place, and time. No cranial nerve deficit.  Skin: Skin is warm and dry.  Psychiatric: He has a normal mood and affect. His  behavior is normal.       Assessment & Plan:   Dysuria with fever, some lower back pain, no flank pain, no nausea/vomiting, no obstructive symptoms some urgency with suprapubic pain ? Bladder infection, prostatitis, less likely risk for kidney stone Bactrim BID x 1 month, check labs -     CBC with Differential/Platelet -     BASIC METABOLIC PANEL WITH GFR -     Hepatic function panel -     Urinalysis, Routine w reflex microscopic -     Urine Culture  The patient was advised to call immediately if he has any concerning symptoms in the interval. The patient voices understanding of current treatment options and is in agreement with the current care plan.The patient knows to call the clinic with any problems, questions or concerns or go to the ER if any further progression of symptoms.

## 2016-12-19 ENCOUNTER — Emergency Department (HOSPITAL_COMMUNITY): Payer: BLUE CROSS/BLUE SHIELD

## 2016-12-19 ENCOUNTER — Encounter (HOSPITAL_COMMUNITY): Payer: Self-pay

## 2016-12-19 ENCOUNTER — Encounter: Payer: Self-pay | Admitting: Physician Assistant

## 2016-12-19 ENCOUNTER — Emergency Department (HOSPITAL_COMMUNITY)
Admission: EM | Admit: 2016-12-19 | Discharge: 2016-12-19 | Disposition: A | Discharge status: Home / Self Care | Payer: BLUE CROSS/BLUE SHIELD | Attending: Emergency Medicine | Admitting: Emergency Medicine

## 2016-12-19 DIAGNOSIS — Z79899 Other long term (current) drug therapy: Secondary | ICD-10-CM | POA: Insufficient documentation

## 2016-12-19 DIAGNOSIS — R0602 Shortness of breath: Secondary | ICD-10-CM | POA: Insufficient documentation

## 2016-12-19 DIAGNOSIS — E785 Hyperlipidemia, unspecified: Secondary | ICD-10-CM | POA: Diagnosis not present

## 2016-12-19 DIAGNOSIS — R072 Precordial pain: Secondary | ICD-10-CM | POA: Insufficient documentation

## 2016-12-19 DIAGNOSIS — R079 Chest pain, unspecified: Secondary | ICD-10-CM | POA: Diagnosis present

## 2016-12-19 LAB — BASIC METABOLIC PANEL
Anion gap: 11 (ref 5–15)
BUN: 12 mg/dL (ref 6–20)
CO2: 23 mmol/L (ref 22–32)
Calcium: 9 mg/dL (ref 8.9–10.3)
Chloride: 104 mmol/L (ref 101–111)
Creatinine, Ser: 1.1 mg/dL (ref 0.61–1.24)
GFR calc Af Amer: >60 mL/min (ref >60)
GLUCOSE: 111 mg/dL — AB (ref 65–99)
POTASSIUM: 3.8 mmol/L (ref 3.5–5.1)
Sodium: 138 mmol/L (ref 135–145)

## 2016-12-19 LAB — D-DIMER, QUANTITATIVE (NOT AT ARMC): D DIMER QUANT: 0.4 ug{FEU}/mL (ref 0.00–0.50)

## 2016-12-19 LAB — URINALYSIS, ROUTINE W REFLEX MICROSCOPIC
Bilirubin Urine: NEGATIVE
Bilirubin Urine: NEGATIVE
GLUCOSE, UA: NEGATIVE mg/dL
Glucose, UA: NEGATIVE
HGB URINE DIPSTICK: NEGATIVE
HGB URINE DIPSTICK: NEGATIVE
KETONES UR: NEGATIVE
KETONES UR: NEGATIVE mg/dL
LEUKOCYTES UA: NEGATIVE
Leukocytes, UA: NEGATIVE
Nitrite: NEGATIVE
Nitrite: NEGATIVE
PH: 6 (ref 5.0–8.0)
PROTEIN: NEGATIVE
PROTEIN: NEGATIVE mg/dL
Specific Gravity, Urine: 1.006 (ref 1.001–1.03)
Specific Gravity, Urine: 1.018 (ref 1.005–1.030)
pH: 5 (ref 5.0–8.0)

## 2016-12-19 LAB — BASIC METABOLIC PANEL WITH GFR
BUN: 14 mg/dL (ref 7–25)
CO2: 28 mmol/L (ref 20–32)
CREATININE: 0.98 mg/dL (ref 0.60–1.35)
Calcium: 9.4 mg/dL (ref 8.6–10.3)
Chloride: 102 mmol/L (ref 98–110)
GFR, Est African American: 113 mL/min/{1.73_m2} (ref >=60)
GFR, Est Non African American: 97 mL/min/{1.73_m2} (ref >=60)
GLUCOSE: 108 mg/dL — AB (ref 65–99)
Potassium: 4 mmol/L (ref 3.5–5.3)
SODIUM: 140 mmol/L (ref 135–146)

## 2016-12-19 LAB — PROTIME-INR
INR: 1.05
PROTHROMBIN TIME: 13.6 s (ref 11.4–15.2)

## 2016-12-19 LAB — URINE CULTURE
MICRO NUMBER: 81404684
SPECIMEN QUALITY: ADEQUATE

## 2016-12-19 LAB — I-STAT TROPONIN, ED
TROPONIN I, POC: 0 ng/mL (ref 0.00–0.08)
Troponin i, poc: 0 ng/mL (ref 0.00–0.08)

## 2016-12-19 LAB — CBC WITH DIFFERENTIAL/PLATELET
BASOS PCT: 0.2 %
Basophils Absolute: 8 cells/uL (ref 0–200)
EOS PCT: 0.2 %
Eosinophils Absolute: 8 cells/uL — ABNORMAL LOW (ref 15–500)
HCT: 40.4 % (ref 38.5–50.0)
HEMOGLOBIN: 13.5 g/dL (ref 13.2–17.1)
Lymphs Abs: 882 cells/uL (ref 850–3900)
MCH: 28.5 pg (ref 27.0–33.0)
MCHC: 33.4 g/dL (ref 32.0–36.0)
MCV: 85.2 fL (ref 80.0–100.0)
MONOS PCT: 17.1 %
MPV: 9.6 fL (ref 7.5–12.5)
Neutro Abs: 2583 cells/uL (ref 1500–7800)
Neutrophils Relative %: 61.5 %
Platelets: 160 10*3/uL (ref 140–400)
RBC: 4.74 10*6/uL (ref 4.20–5.80)
RDW: 13 % (ref 11.0–15.0)
Total Lymphocyte: 21 %
WBC: 4.2 10*3/uL (ref 3.8–10.8)
WBCMIX: 718 {cells}/uL (ref 200–950)

## 2016-12-19 LAB — HEPATIC FUNCTION PANEL
AG RATIO: 1.7 (calc) (ref 1.0–2.5)
ALBUMIN MSPROF: 4.4 g/dL (ref 3.6–5.1)
ALT: 25 U/L (ref 9–46)
ALT: 32 U/L (ref 17–63)
AST: 26 U/L (ref 10–40)
AST: 33 U/L (ref 15–41)
Albumin: 3.9 g/dL (ref 3.5–5.0)
Alkaline Phosphatase: 48 U/L (ref 38–126)
Alkaline phosphatase (APISO): 47 U/L (ref 40–115)
BILIRUBIN TOTAL: 0.5 mg/dL (ref 0.2–1.2)
Bilirubin, Direct: 0.1 mg/dL (ref 0.0–0.2)
GLOBULIN: 2.6 g/dL (ref 1.9–3.7)
Indirect Bilirubin: 0.4 mg/dL (calc) (ref 0.2–1.2)
Total Bilirubin: 0.4 mg/dL (ref 0.3–1.2)
Total Protein: 7 g/dL (ref 6.1–8.1)
Total Protein: 7.1 g/dL (ref 6.5–8.1)

## 2016-12-19 LAB — CBC
HEMATOCRIT: 41.3 % (ref 39.0–52.0)
HEMOGLOBIN: 13.8 g/dL (ref 13.0–17.0)
MCH: 28.8 pg (ref 26.0–34.0)
MCHC: 33.4 g/dL (ref 30.0–36.0)
MCV: 86.2 fL (ref 78.0–100.0)
Platelets: 152 10*3/uL (ref 150–400)
RBC: 4.79 MIL/uL (ref 4.22–5.81)
RDW: 13 % (ref 11.5–15.5)
WBC: 5.2 10*3/uL (ref 4.0–10.5)

## 2016-12-19 LAB — BRAIN NATRIURETIC PEPTIDE: B NATRIURETIC PEPTIDE 5: 4.6 pg/mL (ref 0.0–100.0)

## 2016-12-19 MED ORDER — CIPROFLOXACIN HCL 500 MG PO TABS
500.0000 mg | ORAL_TABLET | Freq: Two times a day (BID) | ORAL | 0 refills | Status: DC
Start: 2016-12-19 — End: 2017-04-06

## 2016-12-19 MED ORDER — CYCLOBENZAPRINE HCL 5 MG PO TABS: 5.0000 mg | ORAL_TABLET | Freq: Two times a day (BID) | ORAL | 0 refills | Status: DC | PRN

## 2016-12-19 NOTE — ED Triage Notes (Addendum)
Pt presents for evaluation of L sided CP starting last night. Pain woke patient up in middle of sleep, radiates to L jaw/neck. Pt reports pain worse with deep inspiration. Given 324 ASA and 1 nitro in route, pain 1/10 on arrival. Pt dx with UTI/prostate infection yesterday at PCP, started bactrim yesterday.

## 2016-12-19 NOTE — ED Provider Notes (Signed)
[facility] Provider Note   CSN: [id number] Arrival date & time: 12/19/16  [id number]     History   Chief Complaint Chief Complaint  Patient presents with  . Chest Pain    HPI Adrian Francis is a 41 y.o. male.  The history is provided by the patient, the spouse and medical records. No language interpreter was used.  Chest Pain   This is a new problem. The current episode started yesterday. The problem occurs constantly. The problem has been gradually improving. The pain is associated with breathing. The pain is present in the substernal region. The pain is at a severity of 7/10. The pain is moderate. The quality of the pain is described as sharp, heavy and pleuritic. The pain radiates to the left neck. Duration of episode(s) is 12 hours. The symptoms are aggravated by deep breathing and certain positions. Associated symptoms include shortness of breath. Pertinent negatives include no abdominal pain, no back pain, no cough, no diaphoresis, no dizziness, no exertional chest pressure, no fever, no headaches, no leg pain, no lower extremity edema, no malaise/fatigue, no nausea, no palpitations and no vomiting. He has tried nothing for the symptoms. The treatment provided no relief. Risk factors include male gender.  His family medical history is significant for PE.    Past Medical History:  Diagnosis Date  . Allergic rhinitis, cause unspecified   . GERD (gastroesophageal reflux disease)   . Hyperlipidemia   . Other abnormal glucose     Patient Active Problem List   Diagnosis Date Noted  . 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 Sinus bradycardia on ECG 10/02/2014  . Obesity 01/23/2014  . Vitamin D deficiency 01/23/2014  . Medication management 01/23/2014  . Mixed hyperlipidemia 06/15/2013  . Prediabetes   . GERD (gastroesophageal reflux disease)   . Allergic rhinitis     Past Surgical History:  Procedure Laterality Date  . TONSILLECTOMY    .  TONSILLECTOMY AND ADENOIDECTOMY         Home Medications    Prior to Admission medications   Medication Sig Start Date End Date Taking? Authorizing Provider  B Complex-C (SUPER B COMPLEX PO) Take by mouth daily.    [provider]  Cholecalciferol (VITAMIN D-3) 5000 UNITS TABS Take 5,000 Units by mouth daily.    [provider]  dicyclomine (BENTYL) 20 MG tablet Take 1 tablet 3 x / day before meals for Nausea, cramping or bloating 09/25/16 10/25/16  Lucky CowboyMcKeown, William, MD  esomeprazole (NEXIUM) 40 MG capsule Take 1 capsule (40 mg total) by mouth daily. 09/25/16 09/25/17  Lucky CowboyMcKeown, William, MD  loratadine (CLARITIN) 10 MG tablet Take 10 mg by mouth every morning.    [provider]  montelukast (SINGULAIR) 10 MG tablet TAKE 1 TABLET DAILY FOR    ALLERGY 08/29/16   Lucky CowboyMcKeown, William, MD  Omega-3 Fatty Acids (FISH OIL PO) Take by mouth. Two daily    [provider]  OVER THE COUNTER MEDICATION Take 2 capsules by mouth daily. zyflamend - spice mixture as an antiinflammatory    [provider]  OVER THE COUNTER MEDICATION Take 1-2 capsules by mouth daily as needed (for anxiety). True calm    [provider]  OVER THE COUNTER MEDICATION 2 tablets daily. Microlactin    [provider]  OVER THE COUNTER MEDICATION 2 tablets daily as needed. Quercetin with Bromelein    [provider]  pravastatin (PRAVACHOL) 20 MG tablet TAKE 1 TABLET EVERY EVENING  05/17/16   Lucky Cowboy, MD  ranitidine (ZANTAC) 150 MG tablet Take 150 mg by mouth 2 (two) times daily.    [provider]  sulfamethoxazole-trimethoprim (BACTRIM DS,SEPTRA DS) 800-160 MG tablet Take 1 tablet by mouth 2 (two) times daily. 12/18/16   [name], PA-C  Zinc 50 MG TABS Take 50 mg by mouth daily.    [provider]  Zn-Pyg Afri-Nettle-Saw Palmet (SAW PALMETTO COMPLEX PO) Take by mouth daily.    [provider]    Family History Family  History  Problem Relation Age of Onset  . Heart disease Other   . Stroke Other   . Lupus Mother   . Hypertension Father   . Cancer Father        precancerous polyps  . Diabetes Maternal Grandmother   . Cancer Maternal Grandmother        melanoma  . Cancer Paternal Grandfather        colon/ prostate    Social History Social History   Tobacco Use  . Smoking status: Never Smoker  . Smokeless tobacco: Never Used  Substance Use Topics  . Alcohol use: No  . Drug use: No     Allergies   Patient has no known allergies.   Review of Systems Review of Systems  Constitutional: Negative for diaphoresis, fever and malaise/fatigue.  HENT: Positive for congestion.   Eyes: Negative for visual disturbance.  Respiratory: Positive for chest tightness and shortness of breath. Negative for cough, choking, wheezing and stridor.   Cardiovascular: Positive for chest pain. Negative for palpitations.  Gastrointestinal: Negative for abdominal pain, constipation, diarrhea, nausea and vomiting.  Genitourinary: Positive for dysuria (resolved yesterday). Negative for flank pain and frequency.  Musculoskeletal: Negative for back pain, neck pain and neck stiffness.  Skin: Negative for rash and wound.  Neurological: Negative for dizziness and headaches.  Psychiatric/Behavioral: Negative for agitation.  All other systems reviewed and are negative.    Physical Exam Updated Vital Signs SpO2 99%   Physical Exam  Constitutional: He is oriented to person, place, and time. He appears well-developed and well-nourished. No distress.  HENT:  Head: Normocephalic and atraumatic.  Mouth/Throat: Oropharynx is clear and moist. No oropharyngeal exudate.  Eyes: Conjunctivae and EOM are normal. Pupils are equal, round, and reactive to light.  Neck: Normal range of motion. No JVD present.  Cardiovascular: Normal rate, normal heart sounds and intact distal pulses.  No murmur heard. Pulmonary/Chest: Effort  normal. No stridor. No respiratory distress. He has no wheezes. He has rales in the right lower field and the left lower field. He exhibits no tenderness.  Abdominal: Soft. Bowel sounds are normal. He exhibits no distension. There is no tenderness. There is no guarding.  Musculoskeletal: He exhibits no tenderness.  Neurological: He is alert and oriented to person, place, and time. No sensory deficit. He exhibits normal muscle tone.  Skin: Capillary refill takes less than 2 seconds. He is not diaphoretic. No erythema. No pallor.  Psychiatric: He has a normal mood and affect.  Nursing note and vitals reviewed.    ED Treatments / Results  Labs (all labs ordered are listed, but only abnormal results are displayed) Labs Reviewed  BASIC METABOLIC PANEL - Abnormal; Notable for the following components:      Result Value   Glucose, Bld 111 (*)    All other components within normal limits  HEPATIC FUNCTION PANEL - Abnormal; Notable for the following components:   Bilirubin, Direct <0.1 (*)  All other components within normal limits  URINE CULTURE  CBC  PROTIME-INR  BRAIN NATRIURETIC PEPTIDE  D-DIMER, QUANTITATIVE (NOT AT [facility])  URINALYSIS, ROUTINE W REFLEX MICROSCOPIC  I-STAT TROPONIN, ED  I-STAT TROPONIN, ED    EKG  EKG Interpretation  Date/Time:  Friday December 19 2016 07:28:12 EST Ventricular Rate:  77 PR Interval:    QRS Duration: 109 QT Interval:  395 QTC Calculation: 447 R Axis:   90 Text Interpretation:  Sinus rhythm Consider right ventricular hypertrophy Baseline wander in lead(s) II III aVL aVF V1 V3 V4 V5 V6 S1Q3T3 No prior ECG for comparison.  No STEMI Confirmed by Theda Belfastegeler, Chris ([id number]) on 12/19/2016 7:40:00 AM       Radiology Dg Chest 2 View  Result Date: 12/19/2016 CLINICAL DATA:  Chest pain. PROCEDURE: CHEST  2 VIEW COMPARISON:  None. FINDINGS: Mediastinum hilar structures normal. Borderline cardiomegaly. No pulmonary venous congestion. Low lung volumes.  Effusion or pneumothorax. No acute bony abnormality. IMPRESSION: Borderline cardiomegaly, no pulmonary venous congestion. Low lung volumes. Electronically Signed   By: Maisie Fushomas  Register   On: 12/19/2016 08:23    Procedures Procedures (including critical care time)  Medications Ordered in ED Medications - No data to display   Initial Impression / Assessment and Plan / ED Course  I have reviewed the triage vital signs and the nursing notes.  Pertinent labs & imaging results that were available during my care of the patient were reviewed by me and considered in my medical decision making (see chart for details).     Adrian MayhewJohn J Beeck is a 41 y.o. male with a past medical history significant for GERD, hyperlipidemia and obesity who presents with chest pain.  Patient reports that he was diagnosed with a urinary tract infection 2 days ago and started on Bactrim yesterday.  He reports that he was having fevers for several days and dysuria he reports the dysuria has improved.  He says that last night after going to bed he developed pain in his left upper chest that radiated into his left neck.  He described it as a sharp and aching pain.  He reports it was very pleuritic with all deep breathing.  He denies it being worsened by exertion.  He denies cough but does report shortness of breath with it.  He denies nausea, vomiting, or diaphoresis.  He denies any recent traumatic injuries but did shovel snow several days ago.  He reports chills but no fevers.  He reports a strong family history of DVT and pulmonary embolism.  He is unsure of cardiac history.  Patient was given 324 of aspirin by EMS as well as 1 nitroglycerin.  The nitroglycerin improved his pain to a 1 out of 10 in severity from 7.  On exam, patient has crackles in the bases of both lungs.  His legs were not significant edematous.  Chest was nontender.  Patient had symmetric pulses in upper and lower extremities.  No evidence of rashes and no  pruritus.  Patient had no focal neurologic deficits.  Chest and abdomen were nontender.  No CVA tenderness.  Initial EKG revealed no STEMI but did show S1/Q3/T3 pattern.  Given the crackles, a BNP will be ordered.  With a strong family history of PE and the EKG findings, d-dimer will be added.  Patient will have a delta troponin.  Patient's heart score is a 3, if delta troponin is reassuring, suspect patient may be safe for discharge home.  Anticipate reassessment after workup.  Second troponin negative.  D-dimer negative.  Suspect musculoskeletal or GI type pain given reassuring workup.  Patient's symptoms improved while in the ED.  Patient will follow up with PCP.  Conversation held with patient about low risk of major adverse cardiac event given low heart score and negative delta troponin.  Patient and family agreed with plan of care as well as return precautions.  Patient had no other questions or concerns and patient discharged in good condition.   Final Clinical Impressions(s) / ED Diagnoses   Final diagnoses:  Precordial pain    ED Discharge Orders        Ordered    cyclobenzaprine (FLEXERIL) 5 MG tablet  2 times daily PRN     12/19/16 1148      Clinical Impression: 1. Precordial pain     Disposition: Discharge  Condition: Good  I have discussed the results, Dx and Tx plan with the pt(& family if present). He/she/they expressed understanding and agree(s) with the plan. Discharge instructions discussed at great length. Strict return precautions discussed and pt &/or family have verbalized understanding of the instructions. No further questions at time of discharge.    This SmartLink is deprecated. Use AVSMEDLIST instead to display the medication list for a patient.  Follow Up: Lucky Cowboy, MD [address] [place] [address] [phone]     [facility] EMERGENCY DEPARTMENT [address] [id number] mc [place] [address] [phone]  If symptoms worsen     Tegeler, Canary Brim, MD 12/19/16 2056

## 2016-12-19 NOTE — Discharge Instructions (Signed)
Your workup today was reassuring for your chest pain.  We did not find evidence of a cardiac, pulmonary, or blood clot cause.  We suspect it may be related to musculoskeletal pain.  Please use the medicine to help with your symptoms and follow-up with your primary care doctor in the next several days.  If any symptoms change or worsen, he is return to the nearest emergency department.

## 2016-12-19 NOTE — ED Notes (Signed)
Pt stable, ambulatory, states understanding of discharge instructions 

## 2016-12-20 LAB — URINE CULTURE: CULTURE: NO GROWTH

## 2017-01-06 ENCOUNTER — Encounter: Payer: Self-pay | Admitting: Physician Assistant

## 2017-01-07 ENCOUNTER — Encounter: Payer: Self-pay | Admitting: Physician Assistant

## 2017-01-07 ENCOUNTER — Other Ambulatory Visit: Payer: Self-pay | Admitting: Internal Medicine

## 2017-01-07 DIAGNOSIS — E782 Mixed hyperlipidemia: Secondary | ICD-10-CM

## 2017-01-07 MED ORDER — MONTELUKAST SODIUM 10 MG PO TABS: ORAL_TABLET | ORAL | 3 refills | Status: AC

## 2017-01-20 ENCOUNTER — Encounter: Payer: Self-pay | Admitting: Physician Assistant

## 2017-04-02 NOTE — Progress Notes (Signed)
Assessment and Plan:  Hypertension:  monitor blood pressure at home. Continue DASH diet.  Reminder to go to the ER if any CP, SOB, nausea, dizziness, severe HA, changes vision/speech, left arm numbness and tingling, and jaw pain. Cholesterol: Continue diet and exercise. Check cholesterol.  Pre-diabetes-Continue diet and exercise. Check A1C Vitamin D Def- check level and continue medications.    Continue diet and meds as discussed. Further disposition pending results of labs. Future Appointments  Date Time Provider Department Center  10/12/2017  9:00 AM Quentin Mullingollier, Edia Pursifull, PA-C [facility] None    HPI 42 y.o. male  presents for 3 month follow up with hypertension, hyperlipidemia, prediabetes and vitamin D. His blood pressure has been controlled at home, today their BP is BP: 118/72 He does not workout. He denies chest pain, shortness of breath, dizziness.  He is on cholesterol medication, pravastatin 20 M,W,F and 10mg  other days and denies myalgias. His cholesterol is at goal, less than 100. The cholesterol last visit was:   Lab Results  Component Value Date   CHOL 192 10/06/2016   HDL 48 10/06/2016   LDLCALC 126 (H) 10/06/2016   TRIG 81 10/06/2016   CHOLHDL 4.0 10/06/2016  He has been working on diet and exercise for prediabetes, and denies paresthesia of the feet, polydipsia, polyuria and visual disturbances. Last A1C in the office was:  Lab Results  Component Value Date   HGBA1C 5.4 10/06/2016  Patient is on Vitamin D supplement.   Lab Results  Component Value Date   VD25OH 562 10/06/2016  He has GERD and in on zantac for this, he has allergies and possible asthma versus GERD, on Singulair/claritin and albuterol rarely.  BMI is Body mass index is 31.45 kg/m., he is working on diet and exercise. He has OSA but not on CPAP due to mask intolerance.  Wt Readings from Last 3 Encounters:  04/06/17 213 lb (96.6 kg)  12/18/16 209 lb (94.8 kg)  10/06/16 207 lb (93.9 kg)   He has a history  of testosterone deficiency and is on testosterone replacement. He states that the testosterone helps with his energy, libido, muscle mass. Has finished bactrim/ABX and not having any issues  Lab Results  Component Value Date   TESTOSTERONE 452 10/02/2015      Current Medications:  Current Outpatient Medications on File Prior to Visit  Medication Sig Dispense Refill  . B Complex-C (SUPER B COMPLEX PO) Take by mouth daily.    . Cholecalciferol (VITAMIN D-3) 5000 UNITS TABS Take 5,000 Units by mouth daily.    . cyclobenzaprine (FLEXERIL) 5 MG tablet Take 1 tablet (5 mg total) by mouth 2 (two) times daily as needed for muscle spasms. 14 tablet 0  . esomeprazole (NEXIUM) 40 MG capsule Take 1 capsule (40 mg total) by mouth daily. 30 capsule 1  . loratadine (CLARITIN) 10 MG tablet Take 10 mg by mouth every morning.    . montelukast (SINGULAIR) 10 MG tablet TAKE 1 TABLET DAILY FOR    ALLERGY 90 tablet 3  . Omega-3 Fatty Acids (FISH OIL PO) Take by mouth. Two daily    . OVER THE COUNTER MEDICATION Take 2 capsules by mouth daily. zyflamend - spice mixture as an antiinflammatory    . OVER THE COUNTER MEDICATION Take 1-2 capsules by mouth daily as needed (for anxiety). True calm    . OVER THE COUNTER MEDICATION 2 tablets daily. Microlactin    . OVER THE COUNTER MEDICATION 2 tablets daily as needed. Quercetin with Bromelein    .  pravastatin (PRAVACHOL) 20 MG tablet TAKE 1 TABLET EVERY EVENING 90 tablet 1  . ranitidine (ZANTAC) 150 MG tablet Take 150 mg by mouth 2 (two) times daily.    . Zinc 50 MG TABS Take 50 mg by mouth daily.    Marland Kitchen Zn-Pyg Afri-Nettle-Saw Palmet (SAW PALMETTO COMPLEX PO) Take by mouth daily.    Marland Kitchen aspirin EC 81 MG tablet Take 324 mg by mouth once.    . ciprofloxacin (CIPRO) 500 MG tablet Take 1 tablet (500 mg total) by mouth 2 (two) times daily. (Patient not taking: Reported on 04/06/2017) 60 tablet 0  . dicyclomine (BENTYL) 20 MG tablet Take 1 tablet 3 x / day before meals for Nausea,  cramping or bloating 90 tablet 0  . sulfamethoxazole-trimethoprim (BACTRIM DS,SEPTRA DS) 800-160 MG tablet Take 1 tablet by mouth 2 (two) times daily. (Patient not taking: Reported on 04/06/2017) 60 tablet 0   No current facility-administered medications on file prior to visit.    Medical History:  Past Medical History:  Diagnosis Date  . Allergic rhinitis, cause unspecified   . GERD (gastroesophageal reflux disease)   . Hyperlipidemia   . Other abnormal glucose    Allergies: No Known Allergies   Review of Systems:  Review of Systems  Constitutional: Negative for chills, diaphoresis, fever, malaise/fatigue and weight loss.  HENT: Negative.   Eyes: Negative.   Respiratory: Negative.   Cardiovascular: Negative.   Gastrointestinal: Negative.   Genitourinary: Negative.   Musculoskeletal: Negative.   Skin: Negative.   Neurological: Negative.  Negative for weakness.       + snoring  Endo/Heme/Allergies: Negative.   Psychiatric/Behavioral: Negative.     Family history- Review and unchanged Social history- Review and unchanged Physical Exam: BP 118/72   Pulse 71   Temp 97.7 F (36.5 C)   Ht 5\' 9"  (1.753 m)   Wt 213 lb (96.6 kg)   SpO2 98%   BMI 31.45 kg/m  Wt Readings from Last 3 Encounters:  04/06/17 213 lb (96.6 kg)  12/18/16 209 lb (94.8 kg)  10/06/16 207 lb (93.9 kg)   General Appearance: Well nourished, in no apparent distress. Eyes: PERRLA, EOMs, conjunctiva no swelling or erythema Sinuses: No Frontal/maxillary tenderness ENT/Mouth: Ext aud canals clear, TMs without erythema, bulging. No erythema, swelling, or exudate on post pharynx.  Tonsils not swollen or erythematous. Hearing normal.  Neck: Supple, thyroid normal.  Crowded mouth Respiratory: Respiratory effort normal, BS equal bilaterally without rales, rhonchi, wheezing or stridor.  Cardio: RRR with no MRGs. Brisk peripheral pulses without edema.  Abdomen: Soft, + BS, obese  Non tender, no guarding, rebound,  hernias, masses. Lymphatics: Non tender without lymphadenopathy.  Musculoskeletal: Full ROM, 5/5 strength, normal gait.  Skin: Warm, dry without rashes, lesions, ecchymosis.  Neuro: Cranial nerves intact. Normal muscle tone, no cerebellar symptoms. Sensation intact.  Psych: Awake and oriented X 3, normal affect, Insight and Judgment appropriate.    [name], PA-C 8:35 AM [facility]

## 2017-04-06 ENCOUNTER — Encounter: Payer: Self-pay | Admitting: Physician Assistant

## 2017-04-06 ENCOUNTER — Ambulatory Visit: Payer: BLUE CROSS/BLUE SHIELD | Admitting: Physician Assistant

## 2017-04-06 VITALS — BP 118/72 | HR 71 | Temp 97.7°F | Ht 69.0 in | Wt 213.0 lb

## 2017-04-06 DIAGNOSIS — E291 Testicular hypofunction: Secondary | ICD-10-CM | POA: Diagnosis not present

## 2017-04-06 DIAGNOSIS — E782 Mixed hyperlipidemia: Secondary | ICD-10-CM | POA: Diagnosis not present

## 2017-04-06 DIAGNOSIS — Z79899 Other long term (current) drug therapy: Secondary | ICD-10-CM | POA: Diagnosis not present

## 2017-04-06 DIAGNOSIS — R7303 Prediabetes: Secondary | ICD-10-CM | POA: Diagnosis not present

## 2017-04-06 MED ORDER — IPRATROPIUM BROMIDE 0.03 % NA SOLN: 2.0000 | Freq: Three times a day (TID) | NASAL | 0 refills | Status: DC

## 2017-04-06 NOTE — Patient Instructions (Signed)
Veggies are great because you can eat a ton! They are low in calories, great to fill you up, and have a ton of vitamins, minerals, and protein.      Check out  Mini habits for weight loss book  2 apps for tracking food is myfitness pal  loseit OR can take picture of your food  8 Critical Weight-Loss Tips That Aren't Diet and Exercise  1. STARVE THE DISTRACTIONS  All too often when we eat, we're also multitasking: watching TV, answering emails, scrolling through social media. These habits are detrimental to having a strong, clear, healthy relationship with food, and they can hinder our ability to make dietary changes.  In order to truly focus on what you're eating, how much you're eating, why you're eating those specific foods and, most importantly, how those foods make you feel, you need to starve the distractions. That means when you eat, just eat. Focus on your food, the process it went through to end up on your plate, where it came from and how it nourishes you. With this technique, you're more likely to finish a meal feeling satiated.  2.  CONSIDER WHAT YOU'RE NOT WILLING TO DO  This might sound counterintuitive, but it can help provide a "why" when motivation is waning. Declare, in writing, what you are unwilling to do, for example "I am unwilling to be the old dad who cannot play sports with my children".  So consider what you're not willing to accept, write it down, and keep it at the ready.  3.  STOP LABELING FOOD "GOOD" AND "BAD"  You've probably heard someone say they ate something "bad." Maybe you've even said it yourself.  The trouble with 'bad' foods isn't that they'll send you to the grave after a bite or two. The trouble comes when we eat excessive portions of really calorie-dense foods meal after meal, day after day.  Instead of labeling foods as good or bad, think about which foods you can eat a lot of, and which ones you should just eat a little of. Then, plan ways  to eat the foods you really like in portions that fit with your overall goals. A good example of this would be having a slice of pizza alongside a club salad with chicken breast, avocado and a bit of dressing. This is vastly different than 3 slices of pizza, 4 breadsticks with cheese sauce and half of a liter of regular soda.  4.  BRUSH YOUR TEETH AFTER YOU EAT  Getting your mindset in order is important, but sometimes small habits can make a big difference. After eating, you still have the taste of food in their mouth, which often causes people to eat more even if they are full or engage in a nibble or two of dessert.  Brushing your teeth will remove the taste of food from your mouth, and the clean, minty freshness will serve as a cue that mealtime is over.  5.  FOCUS ON CROWDING NOT CUTTING  The most common first step during 'dieting' is to cut. We cut our portion sizes down, we cut out 'bad' foods, we cut out entire food groups. This act of cutting puts us and our minds into scarcity mode.  When something is off-limits, even if you're able to avoid it for a while, you could end up bingeing on it later because you've gone so long without it. So, instead of cutting, focus on crowding. If you crowd your plate and fill it  up with more foods like veggies and protein, it simply allows less room for the other stuff. In other words, shift your focus away from what you can't eat, and celebrate the foods that will help you reach your goals.  6.  TAKE TRACKING A STEP FURTHER  Track what you eat, when you ate it, how much you ate and how that food made you feel. Being completely honest with yourself and writing down every single thing that passes through your lips will help you start to notice that maybe you actually do snack, possibly take in more sugar than you thought, eat when you're bored rather than just hungry or maybe that you have a habit of snacking before bed while watching TV.  The difference from  simply tracking your food intake is you're taking into account how food makes you feel, as well as what you're doing while you're eating. This is about becoming more mindful of what, when and why you eat.  7.  PRIORITIZE GOOD SLEEP  One of the strongest risk factors for being overweight is poor sleep. When you're feeling tired, you're more likely to choose unhealthy comfort foods and to skip your workout. Additionally, sleep deprivation may slow down your metabolism. Vesta Mix[employer]! Therefore, sleeping 7-8 hours per night can help with weight loss without having to change your diet or increase your physical activity. And if you feel you snore and still wake up tired, talk with me about sleep apnea.  8.  SET ASIDE TIME TO DISCONNECT  Just get out there. Disconnect from the electronics and connect to the elements. Not only will this help reduce stress (a major factor in weight gain) by giving your mind a break from the constant stimulation we've all become so accustomed to, but it may also reprogram your brain to connect with yourself and what you're feeling.

## 2017-04-07 ENCOUNTER — Encounter: Payer: Self-pay | Admitting: Physician Assistant

## 2017-04-07 LAB — LIPID PANEL
CHOL/HDL RATIO: 4.1 (calc) (ref <5.0)
Cholesterol: 169 mg/dL (ref <200)
HDL: 41 mg/dL (ref >40)
LDL Cholesterol (Calc): 97 mg/dL (calc)
Non-HDL Cholesterol (Calc): 128 mg/dL (calc) (ref <130)
Triglycerides: 211 mg/dL — ABNORMAL HIGH (ref <150)

## 2017-04-07 LAB — HEPATIC FUNCTION PANEL
AG Ratio: 1.7 (calc) (ref 1.0–2.5)
ALBUMIN MSPROF: 4.7 g/dL (ref 3.6–5.1)
ALT: 22 U/L (ref 9–46)
AST: 20 U/L (ref 10–40)
Alkaline phosphatase (APISO): 57 U/L (ref 40–115)
BILIRUBIN DIRECT: 0.1 mg/dL (ref 0.0–0.2)
BILIRUBIN INDIRECT: 0.3 mg/dL (ref 0.2–1.2)
BILIRUBIN TOTAL: 0.4 mg/dL (ref 0.2–1.2)
GLOBULIN: 2.7 g/dL (ref 1.9–3.7)
Total Protein: 7.4 g/dL (ref 6.1–8.1)

## 2017-04-07 LAB — CBC WITH DIFFERENTIAL/PLATELET
BASOS PCT: 0.6 %
Basophils Absolute: 32 cells/uL (ref 0–200)
EOS ABS: 122 {cells}/uL (ref 15–500)
Eosinophils Relative: 2.3 %
HCT: 42.6 % (ref 38.5–50.0)
Hemoglobin: 14.3 g/dL (ref 13.2–17.1)
Lymphs Abs: 1972 cells/uL (ref 850–3900)
MCH: 28.7 pg (ref 27.0–33.0)
MCHC: 33.6 g/dL (ref 32.0–36.0)
MCV: 85.4 fL (ref 80.0–100.0)
MONOS PCT: 9.8 %
MPV: 10 fL (ref 7.5–12.5)
NEUTROS PCT: 50.1 %
Neutro Abs: 2655 cells/uL (ref 1500–7800)
PLATELETS: 226 10*3/uL (ref 140–400)
RBC: 4.99 10*6/uL (ref 4.20–5.80)
RDW: 12.6 % (ref 11.0–15.0)
TOTAL LYMPHOCYTE: 37.2 %
WBC mixed population: 519 cells/uL (ref 200–950)
WBC: 5.3 10*3/uL (ref 3.8–10.8)

## 2017-04-07 LAB — BASIC METABOLIC PANEL WITH GFR
BUN: 14 mg/dL (ref 7–25)
CALCIUM: 9.8 mg/dL (ref 8.6–10.3)
CO2: 27 mmol/L (ref 20–32)
Chloride: 103 mmol/L (ref 98–110)
Creat: 1.03 mg/dL (ref 0.60–1.35)
GFR, EST AFRICAN AMERICAN: 106 mL/min/{1.73_m2} (ref >=60)
GFR, EST NON AFRICAN AMERICAN: 91 mL/min/{1.73_m2} (ref >=60)
Glucose, Bld: 96 mg/dL (ref 65–99)
POTASSIUM: 4.3 mmol/L (ref 3.5–5.3)
Sodium: 142 mmol/L (ref 135–146)

## 2017-04-07 LAB — TSH: TSH: 0.78 m[IU]/L (ref 0.40–4.50)

## 2017-04-07 LAB — MAGNESIUM: MAGNESIUM: 2.1 mg/dL (ref 1.5–2.5)

## 2017-08-10 ENCOUNTER — Other Ambulatory Visit: Payer: Self-pay | Admitting: *Deleted

## 2017-08-10 MED ORDER — IPRATROPIUM BROMIDE 0.03 % NA SOLN
2.0000 | Freq: Three times a day (TID) | NASAL | 0 refills | Status: AC
Start: 2017-08-10 — End: 2018-08-10

## 2017-08-11 ENCOUNTER — Other Ambulatory Visit: Payer: Self-pay | Admitting: Internal Medicine

## 2017-08-26 ENCOUNTER — Other Ambulatory Visit: Payer: Self-pay

## 2017-08-26 DIAGNOSIS — E782 Mixed hyperlipidemia: Secondary | ICD-10-CM

## 2017-08-26 MED ORDER — PRAVASTATIN SODIUM 20 MG PO TABS: 20.0000 mg | ORAL_TABLET | Freq: Every evening | ORAL | 1 refills | Status: AC

## 2017-08-26 MED ORDER — MONTELUKAST SODIUM 10 MG PO TABS: ORAL_TABLET | ORAL | 3 refills | Status: AC

## 2017-09-24 ENCOUNTER — Other Ambulatory Visit: Payer: Self-pay | Admitting: Internal Medicine

## 2017-09-24 DIAGNOSIS — E782 Mixed hyperlipidemia: Secondary | ICD-10-CM

## 2017-10-12 ENCOUNTER — Encounter: Payer: Self-pay | Admitting: Physician Assistant

## 2018-07-04 IMAGING — CR DG CHEST 2V
2 series · 2 of 2 positions shown · non-contrast
Comparison: None.

CLINICAL DATA: Chest pain.

PROCEDURE:
CHEST  2 VIEW

[chest lat]
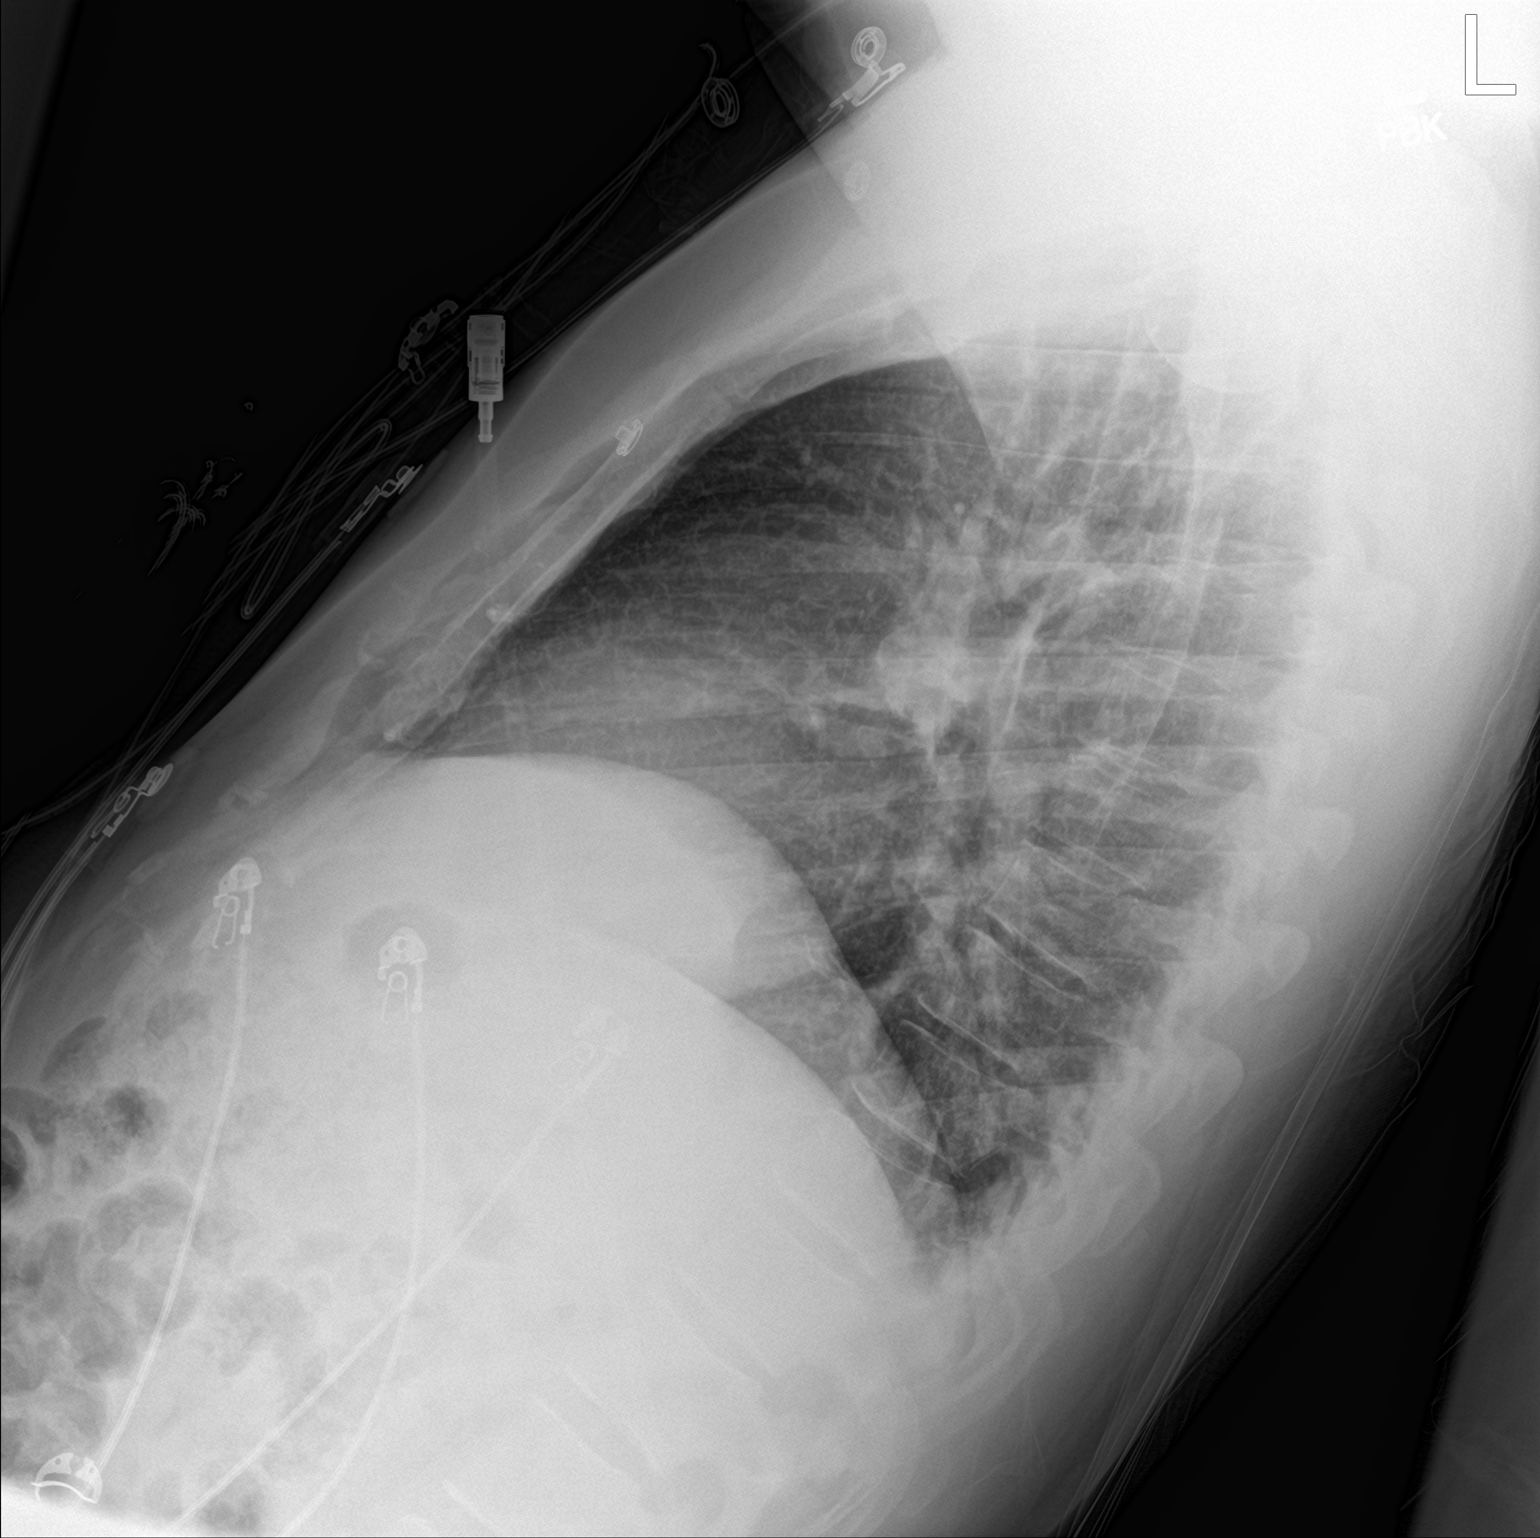

[chest ap]
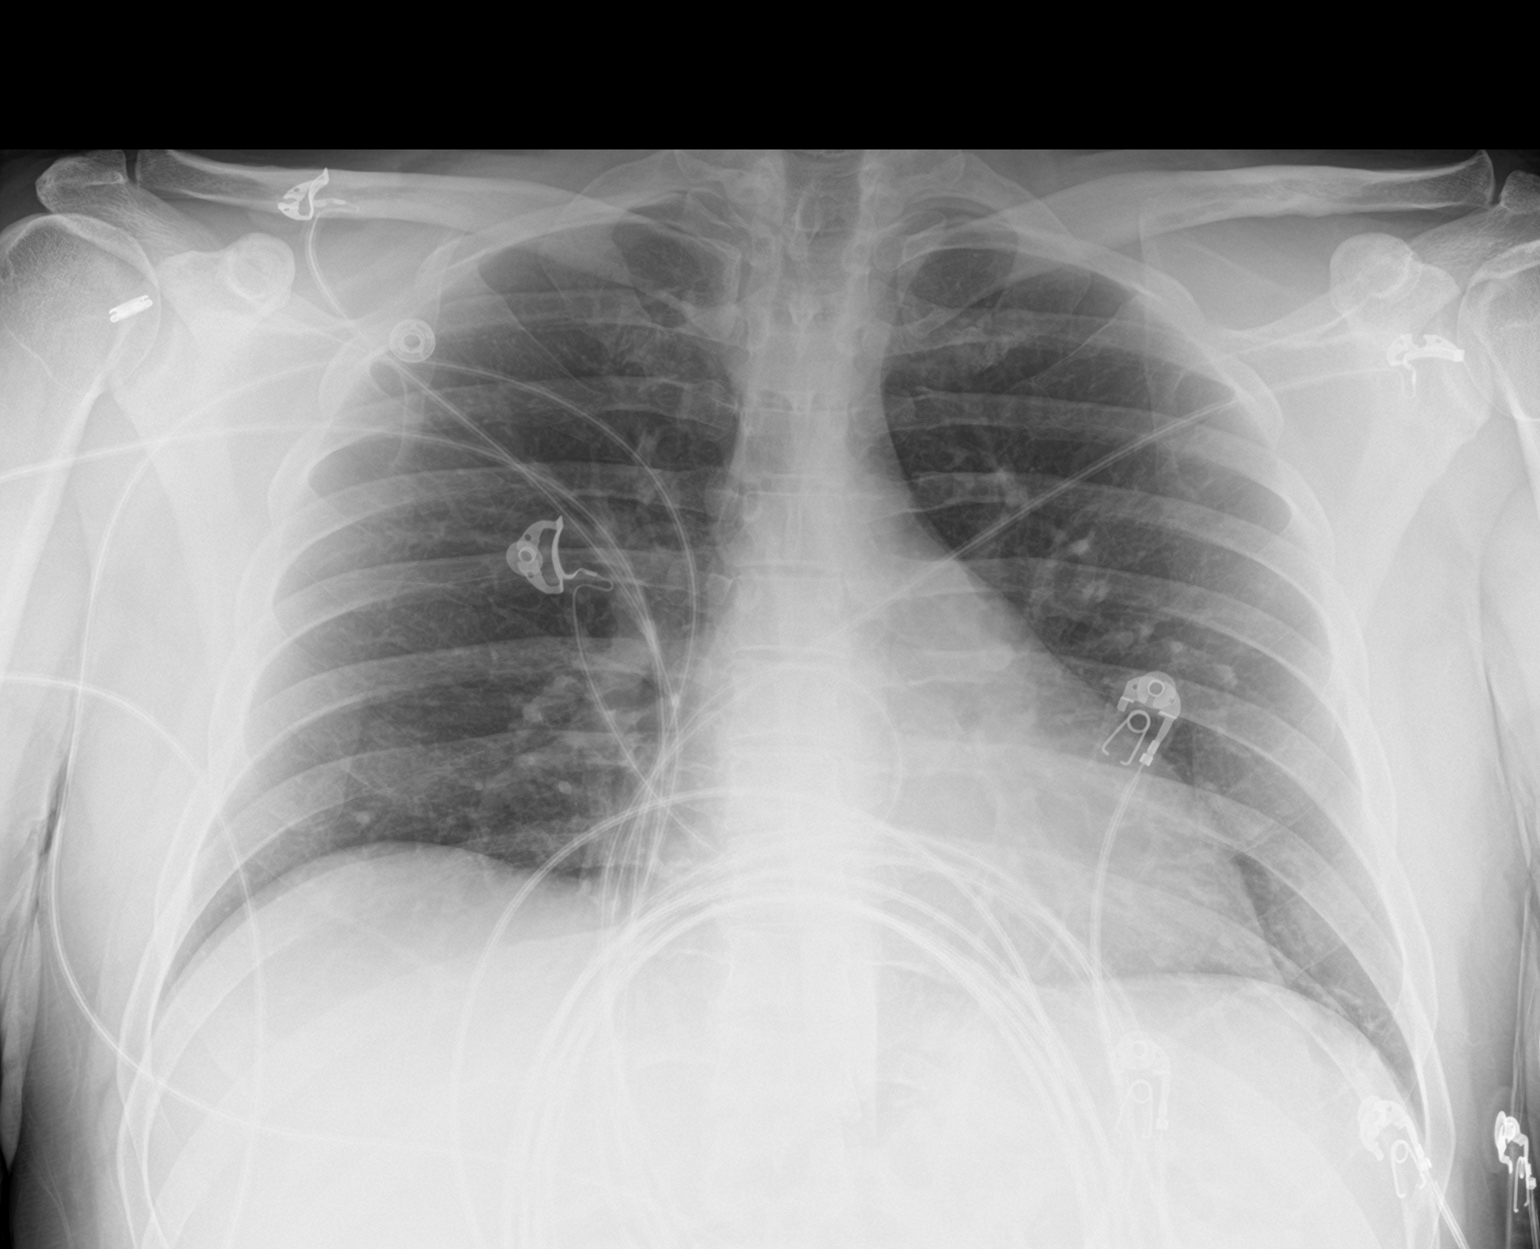

[2 of 2 positions shown; findings below may reference images not displayed]

FINDINGS: Mediastinum hilar structures normal. Borderline cardiomegaly. No
pulmonary venous congestion. Low lung volumes. Effusion or
pneumothorax. No acute bony abnormality.
IMPRESSION: Borderline cardiomegaly, no pulmonary venous congestion. Low lung
volumes.
